# Patient Record
Sex: Female | Born: 1980
Health system: Southern US, Community
[De-identification: ages and names within clinical notes are randomized; demographics above are authoritative.]

## PROBLEM LIST (undated history)

## (undated) ENCOUNTER — Inpatient Hospital Stay (HOSPITAL_COMMUNITY): Payer: Self-pay

## (undated) DIAGNOSIS — D649 Anemia, unspecified: Secondary | ICD-10-CM

## (undated) DIAGNOSIS — J45909 Unspecified asthma, uncomplicated: Secondary | ICD-10-CM

## (undated) DIAGNOSIS — Z803 Family history of malignant neoplasm of breast: Secondary | ICD-10-CM

## (undated) DIAGNOSIS — K37 Unspecified appendicitis: Secondary | ICD-10-CM

## (undated) HISTORY — PX: WISDOM TOOTH EXTRACTION: SHX21

## (undated) HISTORY — PX: OTHER SURGICAL HISTORY: SHX169

## (undated) HISTORY — DX: Family history of malignant neoplasm of breast: Z80.3

---

## 2012-02-03 ENCOUNTER — Encounter (HOSPITAL_COMMUNITY): Payer: Self-pay | Admitting: *Deleted

## 2012-02-03 ENCOUNTER — Emergency Department (HOSPITAL_COMMUNITY)
Admission: EM | Admit: 2012-02-03 | Discharge: 2012-02-03 | Disposition: A | Discharge status: Home / Self Care | Payer: BC Managed Care – PPO | Attending: Emergency Medicine | Admitting: Emergency Medicine

## 2012-02-03 ENCOUNTER — Emergency Department (HOSPITAL_COMMUNITY): Payer: BC Managed Care – PPO

## 2012-02-03 DIAGNOSIS — R1013 Epigastric pain: Secondary | ICD-10-CM | POA: Insufficient documentation

## 2012-02-03 DIAGNOSIS — R110 Nausea: Secondary | ICD-10-CM | POA: Insufficient documentation

## 2012-02-03 DIAGNOSIS — R109 Unspecified abdominal pain: Secondary | ICD-10-CM

## 2012-02-03 HISTORY — DX: Anemia, unspecified: D64.9

## 2012-02-03 LAB — CBC WITH DIFFERENTIAL/PLATELET
Basophils Relative: 0 % (ref 0–1)
Eosinophils Absolute: 0.1 10*3/uL (ref 0.0–0.7)
Eosinophils Relative: 1 % (ref 0–5)
HCT: 36.3 % (ref 36.0–46.0)
Hemoglobin: 12 g/dL (ref 12.0–15.0)
MCH: 25.9 pg — ABNORMAL LOW (ref 26.0–34.0)
MCHC: 33.1 g/dL (ref 30.0–36.0)
Monocytes Absolute: 1 10*3/uL (ref 0.1–1.0)
Monocytes Relative: 8 % (ref 3–12)

## 2012-02-03 LAB — COMPREHENSIVE METABOLIC PANEL
Albumin: 4 g/dL (ref 3.5–5.2)
BUN: 15 mg/dL (ref 6–23)
Calcium: 9.6 mg/dL (ref 8.4–10.5)
Creatinine, Ser: 0.85 mg/dL (ref 0.50–1.10)
Total Bilirubin: 0.3 mg/dL (ref 0.3–1.2)
Total Protein: 6.9 g/dL (ref 6.0–8.3)

## 2012-02-03 LAB — POCT PREGNANCY, URINE: Preg Test, Ur: NEGATIVE

## 2012-02-03 LAB — LIPASE, BLOOD: Lipase: 32 U/L (ref 11–59)

## 2012-02-03 LAB — URINALYSIS, ROUTINE W REFLEX MICROSCOPIC
Bilirubin Urine: NEGATIVE
Glucose, UA: NEGATIVE mg/dL
Ketones, ur: NEGATIVE mg/dL
Protein, ur: NEGATIVE mg/dL
pH: 8 (ref 5.0–8.0)

## 2012-02-03 LAB — URINE MICROSCOPIC-ADD ON

## 2012-02-03 MED ORDER — PROMETHAZINE HCL 25 MG RE SUPP: 25.0000 mg | Freq: Four times a day (QID) | RECTAL | Status: DC | PRN

## 2012-02-03 MED ORDER — DICYCLOMINE HCL 10 MG PO CAPS
20.0000 mg | ORAL_CAPSULE | Freq: Once | ORAL | Status: AC
  Administered 2012-02-03: 20 mg via ORAL
  Filled 2012-02-03: qty 2

## 2012-02-03 MED ORDER — MORPHINE SULFATE 4 MG/ML IJ SOLN
4.0000 mg | Freq: Once | INTRAMUSCULAR | Status: AC
  Administered 2012-02-03: 4 mg via INTRAVENOUS
  Filled 2012-02-03: qty 1

## 2012-02-03 MED ORDER — ONDANSETRON HCL 4 MG PO TABS: 4.0000 mg | ORAL_TABLET | Freq: Three times a day (TID) | ORAL | Status: AC | PRN

## 2012-02-03 MED ORDER — PROMETHAZINE HCL 25 MG PO TABS: 25.0000 mg | ORAL_TABLET | Freq: Four times a day (QID) | ORAL | Status: DC | PRN

## 2012-02-03 MED ORDER — ONDANSETRON HCL 4 MG/2ML IJ SOLN
4.0000 mg | Freq: Once | INTRAMUSCULAR | Status: AC
  Administered 2012-02-03: 4 mg via INTRAVENOUS
  Filled 2012-02-03: qty 2

## 2012-02-03 MED ORDER — SODIUM CHLORIDE 0.9 % IV BOLUS (SEPSIS)
1000.0000 mL | Freq: Once | INTRAVENOUS | Status: AC
  Administered 2012-02-03: 1000 mL via INTRAVENOUS

## 2012-02-03 NOTE — ED Notes (Signed)
Pt unable to void at this time. 

## 2012-02-03 NOTE — ED Notes (Signed)
NAD noted upon discharge home. Prescriptions reviewed.

## 2012-02-03 NOTE — ED Notes (Signed)
Pt. To ultrasound.

## 2012-02-03 NOTE — ED Notes (Signed)
Pt c/o queasiness yesterday followed by epigastric pain and nausea around 10 pm last night.  States increase in bm, though no diarrhea.  Per husband, pt has been writhing in bed.

## 2012-02-03 NOTE — ED Provider Notes (Signed)
History     CSN: [id number]  Arrival date & time 02/03/12  4098   First MD Initiated Contact with Patient 02/03/12 [phone]      Chief Complaint  Patient presents with  . Abdominal Pain    (Consider location/radiation/quality/duration/timing/severity/associated sxs/prior treatment) HPI 31 year old female presents to emergency room complaining of upper abdominal pain along with nausea. Patient reports pain started around 10 PM this evening. She had a normal lunch, 2 bowel movements earlier in the day. Patient reports she was feeling queasy and did not eat much dinner, soon after eating she began to feel much worse. Patient has been unable to sleep well due to pain. She has tried stretching and yoga moves to help alleviate the pain. She has induced vomiting without improvement in symptoms. She denies any prior history of similar pain. No prior history of GERD or gastritis. No sick contacts, no fevers no diarrhea no travel no unusual foods.  Past Medical History  Diagnosis Date  . Anemia     History reviewed. No pertinent past surgical history.  No family history on file.  History  Substance Use Topics  . Smoking status: Not on file  . Smokeless tobacco: Not on file  . Alcohol Use:     OB History    Grav Para Term Preterm Abortions TAB SAB Ect Mult Living                  Review of Systems  All other systems reviewed and are negative.    Allergies  Review of patient's allergies indicates no known allergies.  Home Medications   Current Outpatient Rx  Name Route Sig Dispense Refill  . CALCIUM + D PO Oral Take 1 tablet by mouth daily.    Marland Kitchen FERROUS SULFATE 325 (65 FE) MG PO TABS Oral Take 325 mg by mouth daily with breakfast.    . SEASONIQUE PO Oral Take 1 tablet by mouth daily.    . ADULT MULTIVITAMIN W/MINERALS CH Oral Take 1 tablet by mouth daily.    . SULFAMETHOXAZOLE-TRIMETHOPRIM 400-80 MG PO TABS Oral Take 1 tablet by mouth daily. tx for cyst      BP 115/70   Pulse 75  Temp 98.4 F (36.9 C) (Oral)  Resp 18  SpO2 100%  Physical Exam  Nursing note and vitals reviewed. Constitutional: She is oriented to person, place, and time. She appears well-developed and well-nourished. She appears distressed (Uncomfortable appearing).  HENT:  Head: Normocephalic and atraumatic.  Nose: Nose normal.  Mouth/Throat: Oropharynx is clear and moist.  Eyes: Conjunctivae and EOM are normal. Pupils are equal, round, and reactive to light.  Neck: Normal range of motion. Neck supple. No JVD present. No tracheal deviation present. No thyromegaly present.  Cardiovascular: Normal rate, regular rhythm, normal heart sounds and intact distal pulses.  Exam reveals no gallop and no friction rub.   No murmur heard. Pulmonary/Chest: Effort normal and breath sounds normal. No stridor. No respiratory distress. She has no wheezes. She has no rales. She exhibits no tenderness.  Abdominal: Soft. Bowel sounds are normal. She exhibits no distension and no mass. There is tenderness (positive Murphy sign, tenderness across right upper quadrant epigastrium.). There is no rebound and no guarding.  Musculoskeletal: Normal range of motion. She exhibits no edema and no tenderness.  Lymphadenopathy:    She has no cervical adenopathy.  Neurological: She is oriented to person, place, and time. She exhibits normal muscle tone. Coordination normal.  Skin: Skin is warm and  dry. No rash noted. No erythema. No pallor.  Psychiatric: She has a normal mood and affect. Her behavior is normal. Judgment and thought content normal.    ED Course  Procedures (including critical care time)  Labs Reviewed  URINALYSIS, ROUTINE W REFLEX MICROSCOPIC - Abnormal; Notable for the following:    APPearance CLOUDY (*)     Leukocytes, UA TRACE (*)     All other components within normal limits  CBC WITH DIFFERENTIAL - Abnormal; Notable for the following:    WBC 12.7 (*)     MCH 25.9 (*)     Neutro Abs 9.4 (*)       All other components within normal limits  COMPREHENSIVE METABOLIC PANEL - Abnormal; Notable for the following:    Glucose, Bld 112 (*)     All other components within normal limits  URINE MICROSCOPIC-ADD ON - Abnormal; Notable for the following:    Squamous Epithelial / LPF MANY (*)     Bacteria, UA FEW (*)     All other components within normal limits  LIPASE, BLOOD  POCT PREGNANCY, URINE   US Abdomen Complete  02/03/2012  *RADIOLOGY REPORT*  Clinical Data:  Upper abdominal pain  COMPLETE ABDOMINAL ULTRASOUND  Comparison:  None.  Findings:  Gallbladder:  No gallstones, gallbladder wall thickening, or pericholecystic fluid.  Common bile duct:  Measures 3 mm, within normal limits.  Liver:  No focal lesion identified.  Within normal limits in parenchymal echogenicity.  IVC:  Appears normal.  Pancreas:  No focal abnormality seen.  Spleen:  Measures 6 cm oblique.  Normal echogenicity.  Right Kidney:  Measures 11.8 cm.  No hydronephrosis or focal abnormality.  Left Kidney:  Measures 12.2 cm. Suboptimally visualized due to limited acoustic windows/bowel gas artifact. Question duplicated collecting system.  No hydronephrosis or focal abnormality.  Abdominal aorta:  No aneurysm identified.  IMPRESSION: No cholelithiasis or sonographic evidence for cholecystitis.  The left kidney poorly visualized.  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  This may be technical/artifact.  Correlate with urinalysis if concerned for ascending infection.   Original Report Authenticated By: Waneta Martins, M.D.      1. Abdominal pain       MDM  31 year old female with acute upper abdominal pain. Differential includes gastritis, early gastroenteritis, cholelithiasis or cholecystitis. Will treat with pain and nausea medicine. We'll get abdominal ultrasound for possible cholelithiasis.   7:51 AM Pt feeling better, has tolerated PO fluids.  Will d/c home with nausea  medication, precautions for return.       Olivia Mackie, MD 02/03/12 [phone]

## 2013-10-23 ENCOUNTER — Observation Stay (HOSPITAL_COMMUNITY)
Admission: EM | Admit: 2013-10-23 | Discharge: 2013-10-24 | Disposition: A | Discharge status: Home / Self Care | Payer: BC Managed Care – PPO | Attending: General Surgery | Admitting: General Surgery

## 2013-10-23 ENCOUNTER — Emergency Department (HOSPITAL_COMMUNITY): Payer: BC Managed Care – PPO

## 2013-10-23 ENCOUNTER — Encounter (HOSPITAL_COMMUNITY)
Admission: EM | Disposition: A | Discharge status: Home / Self Care | Payer: Self-pay | Source: Home / Self Care | Attending: Emergency Medicine

## 2013-10-23 ENCOUNTER — Other Ambulatory Visit: Payer: Self-pay | Admitting: Family

## 2013-10-23 ENCOUNTER — Emergency Department (HOSPITAL_COMMUNITY): Payer: BC Managed Care – PPO | Admitting: Anesthesiology

## 2013-10-23 ENCOUNTER — Encounter (HOSPITAL_COMMUNITY): Payer: Self-pay | Admitting: Emergency Medicine

## 2013-10-23 ENCOUNTER — Encounter (HOSPITAL_COMMUNITY): Payer: BC Managed Care – PPO | Admitting: Anesthesiology

## 2013-10-23 DIAGNOSIS — R109 Unspecified abdominal pain: Secondary | ICD-10-CM

## 2013-10-23 DIAGNOSIS — J45909 Unspecified asthma, uncomplicated: Secondary | ICD-10-CM | POA: Insufficient documentation

## 2013-10-23 DIAGNOSIS — K3580 Unspecified acute appendicitis: Secondary | ICD-10-CM

## 2013-10-23 DIAGNOSIS — Z9049 Acquired absence of other specified parts of digestive tract: Secondary | ICD-10-CM

## 2013-10-23 HISTORY — DX: Unspecified asthma, uncomplicated: J45.909

## 2013-10-23 HISTORY — PX: LAPAROSCOPIC APPENDECTOMY: SHX408

## 2013-10-23 LAB — URINALYSIS, ROUTINE W REFLEX MICROSCOPIC
BILIRUBIN URINE: NEGATIVE
GLUCOSE, UA: NEGATIVE mg/dL
HGB URINE DIPSTICK: NEGATIVE
KETONES UR: NEGATIVE mg/dL
LEUKOCYTES UA: NEGATIVE
Nitrite: NEGATIVE
PH: 7.5 (ref 5.0–8.0)
PROTEIN: NEGATIVE mg/dL
Specific Gravity, Urine: 1.011 (ref 1.005–1.030)
Urobilinogen, UA: 0.2 mg/dL (ref 0.0–1.0)

## 2013-10-23 LAB — COMPREHENSIVE METABOLIC PANEL
ALK PHOS: 53 U/L (ref 39–117)
ALT: 51 U/L — AB (ref 0–35)
AST: 32 U/L (ref 0–37)
Albumin: 3.9 g/dL (ref 3.5–5.2)
BUN: 10 mg/dL (ref 6–23)
CHLORIDE: 102 meq/L (ref 96–112)
CO2: 27 meq/L (ref 19–32)
Calcium: 9.4 mg/dL (ref 8.4–10.5)
Creatinine, Ser: 0.72 mg/dL (ref 0.50–1.10)
GLUCOSE: 111 mg/dL — AB (ref 70–99)
Potassium: 4 mEq/L (ref 3.7–5.3)
SODIUM: 141 meq/L (ref 137–147)
Total Protein: 7 g/dL (ref 6.0–8.3)

## 2013-10-23 LAB — CBC WITH DIFFERENTIAL/PLATELET
BASOS ABS: 0 10*3/uL (ref 0.0–0.1)
Basophils Relative: 0 % (ref 0–1)
EOS PCT: 1 % (ref 0–5)
Eosinophils Absolute: 0.1 10*3/uL (ref 0.0–0.7)
HEMATOCRIT: 38.1 % (ref 36.0–46.0)
Hemoglobin: 12.6 g/dL (ref 12.0–15.0)
LYMPHS ABS: 2.2 10*3/uL (ref 0.7–4.0)
LYMPHS PCT: 31 % (ref 12–46)
MCH: 26.6 pg (ref 26.0–34.0)
MCHC: 33.1 g/dL (ref 30.0–36.0)
MCV: 80.5 fL (ref 78.0–100.0)
MONO ABS: 0.5 10*3/uL (ref 0.1–1.0)
Monocytes Relative: 8 % (ref 3–12)
NEUTROS ABS: 4.4 10*3/uL (ref 1.7–7.7)
Neutrophils Relative %: 60 % (ref 43–77)
PLATELETS: 251 10*3/uL (ref 150–400)
RBC: 4.73 MIL/uL (ref 3.87–5.11)
RDW: 13.5 % (ref 11.5–15.5)
WBC: 7.2 10*3/uL (ref 4.0–10.5)

## 2013-10-23 LAB — WET PREP, GENITAL
CLUE CELLS WET PREP: NONE SEEN
Trich, Wet Prep: NONE SEEN
YEAST WET PREP: NONE SEEN

## 2013-10-23 LAB — RPR

## 2013-10-23 LAB — LIPASE, BLOOD: Lipase: 25 U/L (ref 11–59)

## 2013-10-23 LAB — POC URINE PREG, ED: Preg Test, Ur: NEGATIVE

## 2013-10-23 SURGERY — APPENDECTOMY, LAPAROSCOPIC
Anesthesia: General | Site: Abdomen

## 2013-10-23 MED ORDER — SUCCINYLCHOLINE CHLORIDE 20 MG/ML IJ SOLN
INTRAMUSCULAR | Status: DC | PRN
  Administered 2013-10-23: 100 mg via INTRAVENOUS

## 2013-10-23 MED ORDER — MIDAZOLAM HCL 2 MG/2ML IJ SOLN
INTRAMUSCULAR | Status: DC | PRN
  Administered 2013-10-23: 2 mg via INTRAVENOUS

## 2013-10-23 MED ORDER — BUPIVACAINE-EPINEPHRINE (PF) 0.25% -1:200000 IJ SOLN
INTRAMUSCULAR | Status: AC
  Filled 2013-10-23: qty 30

## 2013-10-23 MED ORDER — METRONIDAZOLE IN NACL 5-0.79 MG/ML-% IV SOLN
500.0000 mg | Freq: Once | INTRAVENOUS | Status: AC
  Administered 2013-10-23: 500 mg via INTRAVENOUS
  Filled 2013-10-23: qty 100

## 2013-10-23 MED ORDER — SODIUM CHLORIDE 0.9 % IR SOLN
Status: DC | PRN
  Administered 2013-10-23: 1000 mL

## 2013-10-23 MED ORDER — LIDOCAINE HCL (CARDIAC) 20 MG/ML IV SOLN
INTRAVENOUS | Status: AC
  Filled 2013-10-23: qty 5

## 2013-10-23 MED ORDER — LACTATED RINGERS IV SOLN
INTRAVENOUS | Status: DC | PRN
  Administered 2013-10-23: 22:00:00 via INTRAVENOUS

## 2013-10-23 MED ORDER — FENTANYL CITRATE 0.05 MG/ML IJ SOLN
INTRAMUSCULAR | Status: DC | PRN
  Administered 2013-10-23: 150 ug via INTRAVENOUS
  Administered 2013-10-23: 50 ug via INTRAVENOUS

## 2013-10-23 MED ORDER — ROCURONIUM BROMIDE 100 MG/10ML IV SOLN
INTRAVENOUS | Status: DC | PRN
  Administered 2013-10-23: 30 mg via INTRAVENOUS

## 2013-10-23 MED ORDER — FENTANYL CITRATE 0.05 MG/ML IJ SOLN
INTRAMUSCULAR | Status: AC
  Filled 2013-10-23: qty 5

## 2013-10-23 MED ORDER — IOHEXOL 300 MG/ML  SOLN
100.0000 mL | Freq: Once | INTRAMUSCULAR | Status: AC | PRN
  Administered 2013-10-23: 100 mL via INTRAVENOUS

## 2013-10-23 MED ORDER — PROPOFOL 10 MG/ML IV BOLUS
INTRAVENOUS | Status: DC | PRN
  Administered 2013-10-23: 120 mg via INTRAVENOUS

## 2013-10-23 MED ORDER — ONDANSETRON HCL 4 MG/2ML IJ SOLN
INTRAMUSCULAR | Status: DC | PRN
  Administered 2013-10-23: 4 mg via INTRAVENOUS

## 2013-10-23 MED ORDER — DEXTROSE 5 % IV SOLN
1.0000 g | Freq: Once | INTRAVENOUS | Status: AC
  Administered 2013-10-23: 1 g via INTRAVENOUS
  Filled 2013-10-23: qty 10

## 2013-10-23 MED ORDER — BUPIVACAINE-EPINEPHRINE 0.25% -1:200000 IJ SOLN
INTRAMUSCULAR | Status: DC | PRN
  Administered 2013-10-23: 14 mL

## 2013-10-23 MED ORDER — DEXAMETHASONE SODIUM PHOSPHATE 10 MG/ML IJ SOLN
INTRAMUSCULAR | Status: DC | PRN
  Administered 2013-10-23: 4 mg via INTRAVENOUS

## 2013-10-23 MED ORDER — NEOSTIGMINE METHYLSULFATE 10 MG/10ML IV SOLN
INTRAVENOUS | Status: DC | PRN
  Administered 2013-10-23: 4 mg via INTRAVENOUS

## 2013-10-23 MED ORDER — MIDAZOLAM HCL 2 MG/2ML IJ SOLN
INTRAMUSCULAR | Status: AC
  Filled 2013-10-23: qty 2

## 2013-10-23 MED ORDER — LIDOCAINE HCL (CARDIAC) 20 MG/ML IV SOLN
INTRAVENOUS | Status: DC | PRN
  Administered 2013-10-23: 100 mg via INTRAVENOUS

## 2013-10-23 MED ORDER — PROPOFOL 10 MG/ML IV BOLUS
INTRAVENOUS | Status: AC
  Filled 2013-10-23: qty 20

## 2013-10-23 MED ORDER — GLYCOPYRROLATE 0.2 MG/ML IJ SOLN
INTRAMUSCULAR | Status: DC | PRN
  Administered 2013-10-23: 0.6 mg via INTRAVENOUS

## 2013-10-23 SURGICAL SUPPLY — 48 items
APPLIER CLIP ROT 10 11.4 M/L (STAPLE)
BLADE SURG ROTATE 9660 (MISCELLANEOUS) IMPLANT
CANISTER SUCTION 2500CC (MISCELLANEOUS) ×2 IMPLANT
CHLORAPREP W/TINT 26ML (MISCELLANEOUS) ×2 IMPLANT
CLIP APPLIE ROT 10 11.4 M/L (STAPLE) IMPLANT
COVER SURGICAL LIGHT HANDLE (MISCELLANEOUS) ×2 IMPLANT
CUTTER LINEAR ENDO 35 ETS (STAPLE) IMPLANT
CUTTER LINEAR ENDO 35 ETS TH (STAPLE) ×2 IMPLANT
DECANTER SPIKE VIAL GLASS SM (MISCELLANEOUS) ×2 IMPLANT
DERMABOND ADVANCED (GAUZE/BANDAGES/DRESSINGS) ×1
DERMABOND ADVANCED .7 DNX12 (GAUZE/BANDAGES/DRESSINGS) ×1 IMPLANT
DRAPE UTILITY 15X26 W/TAPE STR (DRAPE) ×4 IMPLANT
ELECT REM PT RETURN 9FT ADLT (ELECTROSURGICAL) ×2
ELECTRODE REM PT RTRN 9FT ADLT (ELECTROSURGICAL) ×1 IMPLANT
ENDOLOOP SUT PDS II  0 18 (SUTURE)
ENDOLOOP SUT PDS II 0 18 (SUTURE) IMPLANT
GLOVE BIO SURGEON STRL SZ8 (GLOVE) ×2 IMPLANT
GLOVE BIOGEL PI IND STRL 7.0 (GLOVE) ×2 IMPLANT
GLOVE BIOGEL PI IND STRL 8 (GLOVE) ×1 IMPLANT
GLOVE BIOGEL PI INDICATOR 7.0 (GLOVE) ×2
GLOVE BIOGEL PI INDICATOR 8 (GLOVE) ×1
GLOVE SURG SS PI 6.0 STRL IVOR (GLOVE) ×4 IMPLANT
GOWN STRL REUS W/ TWL LRG LVL3 (GOWN DISPOSABLE) ×1 IMPLANT
GOWN STRL REUS W/ TWL XL LVL3 (GOWN DISPOSABLE) ×1 IMPLANT
GOWN STRL REUS W/TWL LRG LVL3 (GOWN DISPOSABLE) ×1
GOWN STRL REUS W/TWL XL LVL3 (GOWN DISPOSABLE) ×1
KIT BASIN OR (CUSTOM PROCEDURE TRAY) ×2 IMPLANT
KIT ROOM TURNOVER OR (KITS) ×2 IMPLANT
NEEDLE 22X1 1/2 (OR ONLY) (NEEDLE) ×2 IMPLANT
NS IRRIG 1000ML POUR BTL (IV SOLUTION) ×2 IMPLANT
PAD ARMBOARD 7.5X6 YLW CONV (MISCELLANEOUS) ×4 IMPLANT
POUCH SPECIMEN RETRIEVAL 10MM (ENDOMECHANICALS) ×4 IMPLANT
RELOAD /EVU35 (ENDOMECHANICALS) IMPLANT
RELOAD CUTTER ETS 35MM STAND (ENDOMECHANICALS) IMPLANT
SCALPEL HARMONIC ACE (MISCELLANEOUS) ×2 IMPLANT
SET IRRIG TUBING LAPAROSCOPIC (IRRIGATION / IRRIGATOR) ×2 IMPLANT
SPECIMEN JAR SMALL (MISCELLANEOUS) ×2 IMPLANT
SUT VIC AB 4-0 PS2 27 (SUTURE) ×2 IMPLANT
SWAB COLLECTION DEVICE MRSA (MISCELLANEOUS) IMPLANT
TOWEL OR 17X24 6PK STRL BLUE (TOWEL DISPOSABLE) ×2 IMPLANT
TOWEL OR 17X26 10 PK STRL BLUE (TOWEL DISPOSABLE) ×2 IMPLANT
TRAY FOLEY CATH 16FR SILVER (SET/KITS/TRAYS/PACK) ×2 IMPLANT
TRAY LAPAROSCOPIC (CUSTOM PROCEDURE TRAY) ×2 IMPLANT
TROCAR XCEL 12X100 BLDLESS (ENDOMECHANICALS) ×2 IMPLANT
TROCAR XCEL BLUNT TIP 100MML (ENDOMECHANICALS) ×2 IMPLANT
TROCAR XCEL NON-BLD 5MMX100MML (ENDOMECHANICALS) ×2 IMPLANT
TUBE ANAEROBIC SPECIMEN COL (MISCELLANEOUS) IMPLANT
WATER STERILE IRR 1000ML POUR (IV SOLUTION) IMPLANT

## 2013-10-23 NOTE — ED Notes (Signed)
Informed CT that pt is finished drinking IV contrast.

## 2013-10-23 NOTE — ED Notes (Addendum)
Pt reports onset yesterday morning of lower abd pain, more severe right lower side. Having n/v yesterday, denies any vaginal or urinary symptoms. Pt went to pcp today had blood work and was told to come here due to possible appendicitis.

## 2013-10-23 NOTE — Op Note (Signed)
10/23/2013  11:31 PM  PATIENT:  Kari Hendricks  33 y.o. female  PRE-OPERATIVE DIAGNOSIS:  acute appendicitis  POST-OPERATIVE DIAGNOSIS:  Acute Appendicitis  PROCEDURE:  Procedure(s): APPENDECTOMY LAPAROSCOPIC  SURGEON:  Surgeon(s): Liz MaladyBurke E Montasia Chisenhall, MD  ASSISTANTS: none   ANESTHESIA:   local and general  EBL:  Total I/O In: 800 [I.V.:800] Out: 100 [Urine:100]  BLOOD ADMINISTERED:none  DRAINS: none   SPECIMEN:  Excision  DISPOSITION OF SPECIMEN:  PATHOLOGY  COUNTS:  YES  DICTATION: .Dragon Dictation patient is brought for laparoscopic appendectomy. She was identified in the preop holding area. She received intravenous antibiotic. Informed consent was obtained. She was brought to the operating room and general endotracheal anesthesia was administered by the anesthesia staff. Foley catheter was placed by nursing. Abdomen was prepped and draped in sterile fashion. We did time out procedure. Infraumbilical region was infiltrated with local. Infraumbilical incision was made. Subcutaneous tissues were dissected down through the anterior fascia. Peritoneal cavity was entered under direct vision. 0 Vicryl pursestring suture was placed on the fascial opening. Hassan trocar was inserted. Abdomen was insufflated with carbon monoxide in standard fashion. Under direct vision, a 12 mm left lower quadrant a 5 mm right mid abdomen port were placed. Local was used at each port site. Laparoscopic exploration revealed a very floppy cecum. The appendix was retrocecal but easily identified. It was not perforated but was acutely inflamed. The base was dissected and then divided with Endo GIA stapler. The first stapler did not fire correctly so it was repeated. There was good staple line closure on the cecum. The mesoappendix was then gradually divided with harmonic scalpel staying along the appendix. Appendix was placed in Endo Catch bag and removed via the left lower quadrant port site. Abdomen was  copiously irrigated. Staple line remained intact. There was no bleeding. Irrigation fluid returned clear. Ports were removed under direct vision. Pneumoperitoneum was released. Infraumbilical fascia was closed by tying the pursestring. All 3 wounds were copiously irrigated and the skin of each was closed with running 4-0 Vicryl subcuticular followed by Dermabond. All counts were correct. Patient tolerated procedure well without apparent complication was taken recovery in stable condition.  PATIENT DISPOSITION:  PACU - hemodynamically stable.   Delay start of Pharmacological VTE agent (>24hrs) due to surgical blood loss or risk of bleeding:  no  Violeta GelinasBurke Taleah Bellantoni, MD, MPH, FACS Pager: [phone]  5/19/201511:31 PM

## 2013-10-23 NOTE — Anesthesia Procedure Notes (Signed)
Procedure Name: Intubation Date/Time: 10/23/2013 10:34 PM Performed by: Molli HazardGORDON, Aaniya Sterba M Pre-anesthesia Checklist: Patient identified, Emergency Drugs available, Suction available and Patient being monitored Patient Re-evaluated:Patient Re-evaluated prior to inductionOxygen Delivery Method: Circle system utilized Preoxygenation: Pre-oxygenation with 100% oxygen Intubation Type: IV induction, Rapid sequence and Cricoid Pressure applied Laryngoscope Size: Miller and 2 Grade View: Grade I Tube type: Oral Tube size: 7.0 mm Number of attempts: 1 Airway Equipment and Method: Stylet Placement Confirmation: ETT inserted through vocal cords under direct vision,  positive ETCO2 and breath sounds checked- equal and bilateral Secured at: 21 cm Tube secured with: Tape Dental Injury: Teeth and Oropharynx as per pre-operative assessment

## 2013-10-23 NOTE — ED Provider Notes (Signed)
CSN: [id number]     Arrival date & time 10/23/13  1447 History   First MD Initiated Contact with Patient 10/23/13 1746     Chief Complaint  Patient presents with  . Abdominal Pain     (Consider location/radiation/quality/duration/timing/severity/associated sxs/prior Treatment) HPI Comments: Kari LeiLauren Hendricks is a 33 y.o. Female presenting with right lower quadrant pain which woke her from sleep yesterday morning.  Her pain was severe, sharp, constant and worsened with movement and with lying flat, improved lying on her side in a curled up position.  She also endorses nausea and vomiting multiple episodes but denies diarrhea. Her last bm was yesterday and normal.  She was seen by her pcp yesterday at which time blood work was obtained.  She was called to today and advised to come here to get a CT scan for assess for appendicitis as her wbc count was elevated (was not told the lab number).  Her pain is still present today but improved.  She denies fevers or chills, back pain, vaginal discharge, dysuria.  She has taken ibuprofen without relief of pain.  She is married, denies risk factors for std's.     The history is provided by the patient.    Past Medical History  Diagnosis Date  . Anemia   . Asthma    History reviewed. No pertinent past surgical history. History reviewed. No pertinent family history. History  Substance Use Topics  . Smoking status: Never Smoker   . Smokeless tobacco: Not on file  . Alcohol Use: No   OB History   Grav Para Term Preterm Abortions TAB SAB Ect Mult Living                 Review of Systems  Constitutional: Negative for fever.  HENT: Negative for congestion and sore throat.   Eyes: Negative.   Respiratory: Negative for chest tightness and shortness of breath.   Cardiovascular: Negative for chest pain.  Gastrointestinal: Positive for nausea, vomiting and abdominal pain. Negative for diarrhea and constipation.  Genitourinary: Negative.    Musculoskeletal: Negative for arthralgias, joint swelling and neck pain.  Skin: Negative.  Negative for rash and wound.  Neurological: Negative for dizziness, weakness, light-headedness, numbness and headaches.  Psychiatric/Behavioral: Negative.       Allergies  Review of patient's allergies indicates no known allergies.  Home Medications   Prior to Admission medications   Medication Sig Start Date End Date Taking? Authorizing Provider  Calcium Carbonate-Vitamin D (CALCIUM + D PO) Take 1 tablet by mouth daily.   Yes Historical Provider, MD  ferrous sulfate 325 (65 FE) MG tablet Take 325 mg by mouth daily with breakfast.   Yes Historical Provider, MD  ibuprofen (ADVIL,MOTRIN) 200 MG tablet Take 400 mg by mouth every 6 (six) hours as needed for fever.   Yes Historical Provider, MD  Levonorgest-Eth Estrad 91-Day (SEASONIQUE PO) Take 1 tablet by mouth daily.   Yes Historical Provider, MD  Multiple Vitamin (MULTIVITAMIN WITH MINERALS) TABS Take 1 tablet by mouth daily.   Yes Historical Provider, MD  promethazine (PHENERGAN) 25 MG suppository Place 1 suppository (25 mg total) rectally every 6 (six) hours as needed for nausea. 02/03/12 02/10/12  Olivia Mackielga M Otter, MD  promethazine (PHENERGAN) 25 MG tablet Take 1 tablet (25 mg total) by mouth every 6 (six) hours as needed for nausea. 02/03/12 02/10/12  Olivia Mackielga M Otter, MD   BP 128/79  Pulse 71  Temp(Src) 99.1 F (37.3 C) (Oral)  Resp 18  Ht 5\' 4"  (1.626 m)  Wt 125 lb (56.7 kg)  BMI 21.45 kg/m2  SpO2 99%  LMP 10/16/2013 Physical Exam  Nursing note and vitals reviewed. Constitutional: She appears well-developed and well-nourished.  HENT:  Head: Normocephalic and atraumatic.  Eyes: Conjunctivae are normal.  Neck: Normal range of motion.  Cardiovascular: Normal rate, regular rhythm, normal heart sounds and intact distal pulses.   Pulmonary/Chest: Effort normal and breath sounds normal. She has no wheezes.  Abdominal: Soft. Bowel sounds are normal.  She exhibits no distension and no mass. There is no hepatosplenomegaly. There is tenderness in the right lower quadrant. There is no guarding and no CVA tenderness.  Genitourinary: Vagina normal and uterus normal. Uterus is not enlarged and not tender. Cervix exhibits no motion tenderness and no discharge. Right adnexum displays no mass, no tenderness and no fullness. Left adnexum displays no mass, no tenderness and no fullness. No tenderness around the vagina. No vaginal discharge found.  Musculoskeletal: Normal range of motion.  Neurological: She is alert.  Skin: Skin is warm and dry.  Psychiatric: She has a normal mood and affect.    ED Course  Procedures (including critical care time) Labs Review Labs Reviewed  WET PREP, GENITAL - Abnormal; Notable for the following:    WBC, Wet Prep HPF POC FEW (*)    All other components within normal limits  COMPREHENSIVE METABOLIC PANEL - Abnormal; Notable for the following:    Glucose, Bld 111 (*)    ALT 51 (*)    Total Bilirubin <0.2 (*)    All other components within normal limits  GC/CHLAMYDIA PROBE AMP  CBC WITH DIFFERENTIAL  LIPASE, BLOOD  URINALYSIS, ROUTINE W REFLEX MICROSCOPIC  RPR  POC URINE PREG, ED    Imaging Review Ct Abdomen Pelvis W Contrast  10/23/2013   CLINICAL DATA:  Diffuse abdominal pain.  Vomiting.  EXAM: CT ABDOMEN AND PELVIS WITH CONTRAST  TECHNIQUE: Multidetector CT imaging of the abdomen and pelvis was performed using the standard protocol following bolus administration of intravenous contrast.  CONTRAST:  100mL OMNIPAQUE IOHEXOL 300 MG/ML  SOLN  COMPARISON:  None.  FINDINGS: The patient has acute appendicitis. The appendix is edematous and measures 11 mm in diameter. The cecum lies in the mid pelvis and the appendix is retrocecal and extends superiorly and laterally. There is no perforation. Terminal ileum is normal.  Liver, biliary tree, spleen, pancreas, adrenal glands, and kidneys demonstrate no significant  abnormality. 5 mm low-density lesion in the upper pole of the left kidney most likely represents a tiny cyst.  IMPRESSION: Acute appendicitis. Critical Value/emergent results were called by telephone at the time of interpretation on 10/23/2013 at 7:55 PM to Ms. Gershon Mussel'Malley, [place], who verbally acknowledged these results.   Electronically Signed   By: Geanie CooleyJim  Maxwell M.D.   On: 10/23/2013 19:56     EKG Interpretation None      MDM   Final diagnoses:  Acute appendicitis    Patients labs and/or radiological studies were viewed and considered during the medical decision making and disposition process. Labs normal today except borderline elevated AST.  Discussed with patient.  She reports drinks a glass of wine qod on average,  Last drank a glass 2 days ago.  ? Elevation in this lab.  No RUQ pain,  No + murphy sign.  Call from CT radiology - acute appendicitis.  US cancelled.  Will discuss with general surgery for admission.  Results discussed with patient and her questions were answered.  9:00 PM Still awaiting call back from gen surg.  Asked for repage.  Call back from Dr. Janee Morn.  Will see pt in ed.  Burgess Amor, PA-C 10/23/13 2002  Burgess Amor, PA-C 10/23/13 2021  Burgess Amor, PA-C 10/23/13 2100  Burgess Amor, PA-C 10/23/13 2102

## 2013-10-23 NOTE — Anesthesia Preprocedure Evaluation (Addendum)
Anesthesia Evaluation  Patient identified by MRN, date of birth, ID band Patient awake    Reviewed: Allergy & Precautions, H&P , NPO status , Patient's Chart, lab work & pertinent test results  Airway Mallampati: I TM Distance: >3 FB Neck ROM: Full    Dental  (+) Teeth Intact, Dental Advisory Given   Pulmonary          Cardiovascular     Neuro/Psych    GI/Hepatic   Endo/Other    Renal/GU      Musculoskeletal   Abdominal   Peds  Hematology   Anesthesia Other Findings   Reproductive/Obstetrics                           Anesthesia Physical Anesthesia Plan  ASA: II  Anesthesia Plan: General   Post-op Pain Management:    Induction: Intravenous, Cricoid pressure planned and Rapid sequence  Airway Management Planned: Oral ETT  Additional Equipment:   Intra-op Plan:   Post-operative Plan: Extubation in OR  Informed Consent: I have reviewed the patients History and Physical, chart, labs and discussed the procedure including the risks, benefits and alternatives for the proposed anesthesia with the patient or authorized representative who has indicated his/her understanding and acceptance.   Dental advisory given  Plan Discussed with: Surgeon and CRNA  Anesthesia Plan Comments:        Anesthesia Quick Evaluation

## 2013-10-23 NOTE — H&P (Signed)
Kari Hendricks is an 33 y.o. female.   Chief Complaint: right lower quadrant abdominal pain HPI: patient developed vague lower abdominal pain yesterday morning. She thought it was associated with gas. She had mild anorexia. She was seen at her primary care office and some laboratory studies were drawn. She initially felt a little better. The pain came back worse today and her doctor's office called and told her she had an elevated white count. They directed her to the emergency room for further workup. We put to count here is normal, however, CT scan of the abdomen and pelvis was done showing acute appendicitis. She continues to have right lower quadrant pain.  Past Medical History  Diagnosis Date  . Anemia   . Asthma     History reviewed. No pertinent past surgical history.  History reviewed. No pertinent family history. Social History:  reports that she has never smoked. She does not have any smokeless tobacco history on file. She reports that she does not drink alcohol or use illicit drugs.  Allergies: No Known Allergies   (Not in a hospital admission)  Results for orders placed during the hospital encounter of 10/23/13 (from the past 48 hour(s))  CBC WITH DIFFERENTIAL     Status: None   Collection Time    10/23/13  3:00 PM      Result Value Ref Range   WBC 7.2  4.0 - 10.5 K/uL   RBC 4.73  3.87 - 5.11 MIL/uL   Hemoglobin 12.6  12.0 - 15.0 g/dL   HCT 38.1  36.0 - 46.0 %   MCV 80.5  78.0 - 100.0 fL   MCH 26.6  26.0 - 34.0 pg   MCHC 33.1  30.0 - 36.0 g/dL   RDW 13.5  11.5 - 15.5 %   Platelets 251  150 - 400 K/uL   Neutrophils Relative % 60  43 - 77 %   Neutro Abs 4.4  1.7 - 7.7 K/uL   Lymphocytes Relative 31  12 - 46 %   Lymphs Abs 2.2  0.7 - 4.0 K/uL   Monocytes Relative 8  3 - 12 %   Monocytes Absolute 0.5  0.1 - 1.0 K/uL   Eosinophils Relative 1  0 - 5 %   Eosinophils Absolute 0.1  0.0 - 0.7 K/uL   Basophils Relative 0  0 - 1 %   Basophils Absolute 0.0  0.0 - 0.1 K/uL   COMPREHENSIVE METABOLIC PANEL     Status: Abnormal   Collection Time    10/23/13  3:00 PM      Result Value Ref Range   Sodium 141  137 - 147 mEq/L   Potassium 4.0  3.7 - 5.3 mEq/L   Chloride 102  96 - 112 mEq/L   CO2 27  19 - 32 mEq/L   Glucose, Bld 111 (*) 70 - 99 mg/dL   BUN 10  6 - 23 mg/dL   Creatinine, Ser 0.72  0.50 - 1.10 mg/dL   Calcium 9.4  8.4 - 10.5 mg/dL   Total Protein 7.0  6.0 - 8.3 g/dL   Albumin 3.9  3.5 - 5.2 g/dL   AST 32  0 - 37 U/L   ALT 51 (*) 0 - 35 U/L   Alkaline Phosphatase 53  39 - 117 U/L   Total Bilirubin <0.2 (*) 0.3 - 1.2 mg/dL   GFR calc non Af Amer >90  >90 mL/min   GFR calc Af Amer >90  >90 mL/min  Comment: (NOTE)     The eGFR has been calculated using the CKD EPI equation.     This calculation has not been validated in all clinical situations.     eGFR's persistently <90 mL/min signify possible Chronic Kidney     Disease.  LIPASE, BLOOD     Status: None   Collection Time    10/23/13  3:00 PM      Result Value Ref Range   Lipase 25  11 - 59 U/L  URINALYSIS, ROUTINE W REFLEX MICROSCOPIC     Status: None   Collection Time    10/23/13  4:06 PM      Result Value Ref Range   Color, Urine YELLOW  YELLOW   APPearance CLEAR  CLEAR   Specific Gravity, Urine 1.011  1.005 - 1.030   pH 7.5  5.0 - 8.0   Glucose, UA NEGATIVE  NEGATIVE mg/dL   Hgb urine dipstick NEGATIVE  NEGATIVE   Bilirubin Urine NEGATIVE  NEGATIVE   Ketones, ur NEGATIVE  NEGATIVE mg/dL   Protein, ur NEGATIVE  NEGATIVE mg/dL   Urobilinogen, UA 0.2  0.0 - 1.0 mg/dL   Nitrite NEGATIVE  NEGATIVE   Leukocytes, UA NEGATIVE  NEGATIVE   Comment: MICROSCOPIC NOT DONE ON URINES WITH NEGATIVE PROTEIN, BLOOD, LEUKOCYTES, NITRITE, OR GLUCOSE <1000 mg/dL.  POC URINE PREG, ED     Status: None   Collection Time    10/23/13  4:14 PM      Result Value Ref Range   Preg Test, Ur NEGATIVE  NEGATIVE   Comment:            THE SENSITIVITY OF THIS     METHODOLOGY IS >24 mIU/mL  WET PREP, GENITAL      Status: Abnormal   Collection Time    10/23/13  7:07 PM      Result Value Ref Range   Yeast Wet Prep HPF POC NONE SEEN  NONE SEEN   Trich, Wet Prep NONE SEEN  NONE SEEN   Clue Cells Wet Prep HPF POC NONE SEEN  NONE SEEN   WBC, Wet Prep HPF POC FEW (*) NONE SEEN   Ct Abdomen Pelvis W Contrast  10/23/2013   CLINICAL DATA:  Diffuse abdominal pain.  Vomiting.  EXAM: CT ABDOMEN AND PELVIS WITH CONTRAST  TECHNIQUE: Multidetector CT imaging of the abdomen and pelvis was performed using the standard protocol following bolus administration of intravenous contrast.  CONTRAST:  128m OMNIPAQUE IOHEXOL 300 MG/ML  SOLN  COMPARISON:  None.  FINDINGS: The patient has acute appendicitis. The appendix is edematous and measures 11 mm in diameter. The cecum lies in the mid pelvis and the appendix is retrocecal and extends superiorly and laterally. There is no perforation. Terminal ileum is normal.  Liver, biliary tree, spleen, pancreas, adrenal glands, and kidneys demonstrate no significant abnormality. 5 mm low-density lesion in the upper pole of the left kidney most likely represents a tiny cyst.  IMPRESSION: Acute appendicitis. Critical Value/emergent results were called by telephone at the time of interpretation on 10/23/2013 at 7:55 PM to Ms. OHilda Blades PUtah who verbally acknowledged these results.   Electronically Signed   By: JRozetta NunneryM.D.   On: 10/23/2013 19:56    Review of Systems  Constitutional: Negative for fever and chills.  HENT: Negative.   Eyes: Negative.   Cardiovascular: Negative.   Gastrointestinal: Positive for nausea and abdominal pain. Negative for vomiting.  Genitourinary: Negative.   Musculoskeletal: Negative.   Skin: Negative.  Neurological: Negative.   Endo/Heme/Allergies: Negative.   Psychiatric/Behavioral: Negative.     Blood pressure 143/83, pulse 79, temperature 99.1 F (37.3 C), temperature source Oral, resp. rate 16, height '5\' 4"'  (1.626 m), weight 125 lb (56.7 kg), last  menstrual period 10/16/2013, SpO2 100.00%. Physical Exam  Constitutional: She is oriented to person, place, and time. She appears well-developed and well-nourished. No distress.  HENT:  Head: Normocephalic and atraumatic.  Nose: Nose normal.  Mouth/Throat: Oropharynx is clear and moist.  Eyes: EOM are normal. Pupils are equal, round, and reactive to light. Right eye exhibits no discharge. Left eye exhibits no discharge. No scleral icterus.  Neck: Normal range of motion. Neck supple. No tracheal deviation present.  Cardiovascular: Normal rate, normal heart sounds and intact distal pulses.   Respiratory: Effort normal and breath sounds normal. No stridor. No respiratory distress. She has no wheezes. She has no rales. She exhibits no tenderness.  GI: Soft. She exhibits no distension. There is tenderness. There is no rebound and no guarding.  Tender right lower quadrant without guarding, no peritonitis  Musculoskeletal: Normal range of motion.  Neurological: She is alert and oriented to person, place, and time.  Skin: Skin is warm.  Psychiatric: She has a normal mood and affect.     Assessment/Plan Acute appendicitis: will start IV antibiotics and take to the operating room for laparoscopic appendectomy. Procedure, risks, & benefits were discussed in detail the patient and her husband. She is agreeable.  Zenovia Jarred 10/23/2013, 9:33 PM

## 2013-10-23 NOTE — ED Notes (Signed)
Pt unable to obtain urine sample at this time 

## 2013-10-23 NOTE — Transfer of Care (Signed)
Immediate Anesthesia Transfer of Care Note  Patient: Kari Hendricks LeiLauren Hendricks  Procedure(s) Performed: Procedure(s): APPENDECTOMY LAPAROSCOPIC (N/A)  Patient Location: PACU  Anesthesia Type:General  Level of Consciousness: awake, alert  and oriented  Airway & Oxygen Therapy: Patient Spontanous Breathing and Patient connected to nasal cannula oxygen  Post-op Assessment: Report given to PACU RN, Post -op Vital signs reviewed and stable and Patient moving all extremities X 4  Post vital signs: Reviewed and stable  Complications: No apparent anesthesia complications

## 2013-10-24 ENCOUNTER — Encounter (INDEPENDENT_AMBULATORY_CARE_PROVIDER_SITE_OTHER): Payer: Self-pay

## 2013-10-24 DIAGNOSIS — K3580 Unspecified acute appendicitis: Secondary | ICD-10-CM | POA: Diagnosis present

## 2013-10-24 LAB — GC/CHLAMYDIA PROBE AMP
CT Probe RNA: NEGATIVE
GC Probe RNA: NEGATIVE

## 2013-10-24 MED ORDER — MORPHINE SULFATE 2 MG/ML IJ SOLN
2.0000 mg | INTRAMUSCULAR | Status: DC | PRN
  Administered 2013-10-24: 2 mg via INTRAVENOUS
  Filled 2013-10-24: qty 1

## 2013-10-24 MED ORDER — ACETAMINOPHEN 325 MG PO TABS: 650.0000 mg | ORAL_TABLET | ORAL | Status: DC | PRN

## 2013-10-24 MED ORDER — OXYCODONE-ACETAMINOPHEN 5-325 MG PO TABS
1.0000 | ORAL_TABLET | ORAL | Status: DC | PRN
  Administered 2013-10-24 (×2): 2 via ORAL
  Filled 2013-10-24 (×2): qty 2

## 2013-10-24 MED ORDER — ONDANSETRON HCL 4 MG PO TABS: 4.0000 mg | ORAL_TABLET | Freq: Four times a day (QID) | ORAL | Status: DC | PRN

## 2013-10-24 MED ORDER — ONDANSETRON HCL 4 MG/2ML IJ SOLN
4.0000 mg | Freq: Four times a day (QID) | INTRAMUSCULAR | Status: DC | PRN
Start: 2013-10-24 — End: 2013-10-24

## 2013-10-24 MED ORDER — HEPARIN SODIUM (PORCINE) 5000 UNIT/ML IJ SOLN
5000.0000 [IU] | Freq: Three times a day (TID) | INTRAMUSCULAR | Status: DC
  Filled 2013-10-24 (×3): qty 1

## 2013-10-24 MED ORDER — OXYCODONE-ACETAMINOPHEN 5-325 MG PO TABS: 1.0000 | ORAL_TABLET | Freq: Four times a day (QID) | ORAL | Status: DC | PRN

## 2013-10-24 NOTE — Discharge Summary (Signed)
Physician Discharge Summary  Patient ID: Kari Hendricks MRN: [id number] DOB/AGE: May 22, 1981 32 y.o.  Admit date: 10/23/2013 Discharge date: 10/24/2013  Admitting Diagnosis: Acute appendicitis  Discharge Diagnosis Patient Active Problem List   Diagnosis Date Noted  . Acute appendicitis 10/24/2013  . S/P laparoscopic appendectomy 10/23/2013    Consultants None  Imaging: Ct Abdomen Pelvis W Contrast  10/23/2013   CLINICAL DATA:  Diffuse abdominal pain.  Vomiting.  EXAM: CT ABDOMEN AND PELVIS WITH CONTRAST  TECHNIQUE: Multidetector CT imaging of the abdomen and pelvis was performed using the standard protocol following bolus administration of intravenous contrast.  CONTRAST:  100mL OMNIPAQUE IOHEXOL 300 MG/ML  SOLN  COMPARISON:  None.  FINDINGS: The patient has acute appendicitis. The appendix is edematous and measures 11 mm in diameter. The cecum lies in the mid pelvis and the appendix is retrocecal and extends superiorly and laterally. There is no perforation. Terminal ileum is normal.  Liver, biliary tree, spleen, pancreas, adrenal glands, and kidneys demonstrate no significant abnormality. 5 mm low-density lesion in the upper pole of the left kidney most likely represents a tiny cyst.  IMPRESSION: Acute appendicitis. Critical Value/emergent results were called by telephone at the time of interpretation on 10/23/2013 at 7:55 PM to Ms. Gershon Mussel'Malley, [place], who verbally acknowledged these results.   Electronically Signed   By: Geanie CooleyJim  Maxwell M.D.   On: 10/23/2013 19:56    Procedures Dr. Janee Mornhompson (10/23/13) - Laparoscopic Appendectomy  Hospital Course:  33 y/o female developed vague lower abdominal pain on the morning of 10/22/13. She thought it was associated with gas. She had mild anorexia. She was seen at her primary care office and some laboratory studies were drawn. She initially felt a little better. The pain came back worse today and her doctor's office called and told her she had an elevated  white count. They directed her to the emergency room for further workup. We put to count here is normal, however, CT scan of the abdomen and pelvis was done showing acute appendicitis. She continues to have right lower quadrant pain.  Workup showed acute appendicitis with normal WBC.  Patient was admitted and underwent procedure listed above.  Tolerated procedure well and was transferred to the floor.  Diet was advanced as tolerated.  On POD #1, the patient was voiding well, tolerating diet, ambulating well, pain well controlled, vital signs stable, incisions c/d/i and felt stable for discharge home.  Patient will follow up in our office in 3 weeks and knows to call with questions or concerns.      Medication List         CALCIUM + D PO  Take 1 tablet by mouth daily.     ferrous sulfate 325 (65 FE) MG tablet  Take 325 mg by mouth daily with breakfast.     ibuprofen 200 MG tablet  Commonly known as:  ADVIL,MOTRIN  Take 400 mg by mouth every 6 (six) hours as needed for fever.     multivitamin with minerals Tabs tablet  Take 1 tablet by mouth daily.     oxyCODONE-acetaminophen 5-325 MG per tablet  Commonly known as:  PERCOCET/ROXICET  Take 1-2 tablets by mouth every 6 (six) hours as needed for moderate pain.     promethazine 25 MG suppository  Commonly known as:  PHENERGAN  Place 1 suppository (25 mg total) rectally every 6 (six) hours as needed for nausea.     promethazine 25 MG tablet  Commonly known as:  PHENERGAN  Take 1  tablet (25 mg total) by mouth every 6 (six) hours as needed for nausea.     SEASONIQUE PO  Take 1 tablet by mouth daily.         Follow-up Information   Follow up with [facility] On 11/13/2013. (For post-operation check on the 11/13/13 at 2:45pm, please arrive 30 mintues early to check in and fill out paperwork)    Contact information:   [address]   [place] [address] [phone]       Signed: Candiss NorseMegan Dort,  PA-[facility] [phone]  10/24/2013, 9:04 AM

## 2013-10-24 NOTE — Progress Notes (Signed)
Pt/family given discharge instructions, medication lists, follow up appointments, and when to call the doctor.  Pt/family verbalizes understanding. Pt given signs and symptoms of infection. Called [facility] for letter for work. Thomas HoffSandra McClintock Bobak Oguinn, RN

## 2013-10-24 NOTE — Anesthesia Postprocedure Evaluation (Signed)
Anesthesia Post Note  Patient: Kari Hendricks  Procedure(s) Performed: Procedure(s) (LRB): APPENDECTOMY LAPAROSCOPIC (N/A)  Anesthesia type: general  Patient location: PACU  Post pain: Pain level controlled  Post assessment: Patient's Cardiovascular Status Stable  Last Vitals:  Filed Vitals:   10/24/13 0510  BP: 127/63  Pulse: 62  Temp: 37 C  Resp: 18    Post vital signs: Reviewed and stable  Level of consciousness: sedated  Complications: No apparent anesthesia complications

## 2013-10-24 NOTE — Progress Notes (Signed)
1 Day Post-Op  Subjective: PT doing well this AM.  No c/o.    Objective: Vital signs in last 24 hours: Temp:  [98 F (36.7 C)-99.5 F (37.5 C)] 98.6 F (37 C) (05/20 0510) Pulse Rate:  [62-91] 62 (05/20 0510) Resp:  [16-18] 18 (05/20 0510) BP: (113-143)/(63-91) 127/63 mmHg (05/20 0510) SpO2:  [99 %-100 %] 100 % (05/20 0510) Weight:  [125 lb (56.7 kg)-131 lb 13.4 oz (59.8 kg)] 131 lb 13.4 oz (59.8 kg) (05/20 0505) Last BM Date: 10/22/13  Intake/Output from previous day: 05/19 0701 - 05/20 0700 In: 1160 [P.O.:360; I.V.:800] Out: 650 [Urine:650] Intake/Output this shift:    General appearance: alert and cooperative Cardio: regular rate and rhythm, S1, S2 normal, no murmur, click, rub or gallop GI: s/approp ttp, ND, wound c/d/i  Lab Results:   Recent Labs  10/23/13 1500  WBC 7.2  HGB 12.6  HCT 38.1  PLT 251   BMET  Recent Labs  10/23/13 1500  NA 141  K 4.0  CL 102  CO2 27  GLUCOSE 111*  BUN 10  CREATININE 0.72  CALCIUM 9.4   PT/INR No results found for this basename: LABPROT, INR,  in the last 72 hours ABG No results found for this basename: PHART, PCO2, PO2, HCO3,  in the last 72 hours  Studies/Results: Ct Abdomen Pelvis W Contrast  10/23/2013   CLINICAL DATA:  Diffuse abdominal pain.  Vomiting.  EXAM: CT ABDOMEN AND PELVIS WITH CONTRAST  TECHNIQUE: Multidetector CT imaging of the abdomen and pelvis was performed using the standard protocol following bolus administration of intravenous contrast.  CONTRAST:  100mL OMNIPAQUE IOHEXOL 300 MG/ML  SOLN  COMPARISON:  None.  FINDINGS: The patient has acute appendicitis. The appendix is edematous and measures 11 mm in diameter. The cecum lies in the mid pelvis and the appendix is retrocecal and extends superiorly and laterally. There is no perforation. Terminal ileum is normal.  Liver, biliary tree, spleen, pancreas, adrenal glands, and kidneys demonstrate no significant abnormality. 5 mm low-density lesion in the upper  pole of the left kidney most likely represents a tiny cyst.  IMPRESSION: Acute appendicitis. Critical Value/emergent results were called by telephone at the time of interpretation on 10/23/2013 at 7:55 PM to Ms. Gershon Mussel'Malley, [place], who verbally acknowledged these results.   Electronically Signed   By: Geanie CooleyJim  Maxwell M.D.   On: 10/23/2013 19:56    Anti-infectives: Anti-infectives   Start     Dose/Rate Route Frequency Ordered Stop   10/23/13 2130  metroNIDAZOLE (FLAGYL) IVPB 500 mg     500 mg 100 mL/hr over 60 Minutes Intravenous  Once 10/23/13 2129 10/23/13 2219   10/23/13 2130  cefTRIAXone (ROCEPHIN) 1 g in dextrose 5 % 50 mL IVPB     1 g 100 mL/hr over 30 Minutes Intravenous  Once 10/23/13 2129 10/23/13 2216      Assessment/Plan: s/p Procedure(s): APPENDECTOMY LAPAROSCOPIC (N/A) Advance diet Home today if tol PO well F/u in 2 weeks   LOS: 1 day    Axel Fillerrmando Nieve Rojero 10/24/2013

## 2013-10-24 NOTE — Discharge Instructions (Signed)
[facility] ______[facility] °LAPAROSCOPIC SURGERY: POST OP INSTRUCTIONS °Always review your discharge instruction sheet given to you by the facility where your surgery was performed. °IF YOU HAVE DISABILITY OR FAMILY LEAVE FORMS, YOU MUST BRING THEM TO THE OFFICE FOR PROCESSING.   °DO NOT GIVE THEM TO YOUR DOCTOR. ° °1. A prescription for pain medication may be given to you upon discharge.  Take your pain medication as prescribed, if needed.  If narcotic pain medicine is not needed, then you may take acetaminophen (Tylenol) or ibuprofen (Advil) as needed. °2. Take your usually prescribed medications unless otherwise directed. °3. If you need a refill on your pain medication, please contact your pharmacy.  They will contact our office to request authorization. Prescriptions will not be filled after 5pm or on week-ends. °4. You should follow a light diet the first few days after arrival home, such as soup and crackers, etc.  Be sure to include lots of fluids daily. °5. Most patients will experience some swelling and bruising in the area of the incisions.  Ice packs will help.  Swelling and bruising can take several days to resolve.  °6. It is common to experience some constipation if taking pain medication after surgery.  Increasing fluid intake and taking a stool softener (such as Colace) will usually help or prevent this problem from occurring.  A mild laxative (Milk of Magnesia or Miralax) should be taken according to package instructions if there are no bowel movements after 48 hours. °7. Unless discharge instructions indicate otherwise, you may remove your bandages 24-48 hours after surgery, and you may shower at that time.  You may have steri-strips (small skin tapes) in place directly over the incision.  These strips should be left on the skin for 7-10 days.  If your surgeon used skin glue on the incision, you may shower in 24 hours.  The glue will flake off over the next 2-3 weeks.  Any sutures or  staples will be removed at the office during your follow-up visit. °8. ACTIVITIES:  You may resume regular (light) daily activities beginning the next day--such as daily self-care, walking, climbing stairs--gradually increasing activities as tolerated.  You may have sexual intercourse when it is comfortable.  Refrain from any heavy lifting or straining until approved by your doctor. °a. You may drive when you are no longer taking prescription pain medication, you can comfortably wear a seatbelt, and you can safely maneuver your car and apply brakes. °b. RETURN TO WORK:  __________________________________________________________ °9. You should see your doctor in the office for a follow-up appointment approximately 2-3 weeks after your surgery.  Make sure that you call for this appointment within a day or two after you arrive home to insure a convenient appointment time. °10. OTHER INSTRUCTIONS: __________________________________________________________________________________________________________________________ __________________________________________________________________________________________________________________________ °WHEN TO CALL YOUR DOCTOR: °1. Fever over 101.0 °2. Inability to urinate °3. Continued bleeding from incision. °4. Increased pain, redness, or drainage from the incision. °5. Increasing abdominal pain ° °The clinic staff is available to answer your questions during regular business hours.  Please don’t hesitate to call and ask to speak to one of the nurses for clinical concerns.  If you have a medical emergency, go to the nearest emergency room or call 911.  A surgeon from [facility] is always on call at the hospital. °[address], [address], [place], [place]  [address] ? [address] [address], [place], [place]   [address] °[phone] ? [phone] ? FAX [phone] °Web site:   [email or web address] °

## 2013-10-25 NOTE — ED Provider Notes (Signed)
Medical screening examination/treatment/procedure(s) were performed by non-physician practitioner and as supervising physician I was immediately available for consultation/collaboration.      Laray AngerKathleen M Leandre Wien, DO 10/25/13 2137

## 2013-10-26 ENCOUNTER — Encounter (HOSPITAL_COMMUNITY): Payer: Self-pay | Admitting: General Surgery

## 2013-11-01 ENCOUNTER — Telehealth (INDEPENDENT_AMBULATORY_CARE_PROVIDER_SITE_OTHER): Payer: Self-pay

## 2013-11-01 NOTE — Telephone Encounter (Signed)
Pt s/p lap appy on 10/1913. Pt calling today to make sure that it would be ok for pt to schedule a appt to see her Dentist. Advised the patient unless  she has a cardiac issue that she normally has to take antibiotics before there is no surgical reason she should not go see the dentist. Pt verbalized understanding.

## 2013-11-13 ENCOUNTER — Ambulatory Visit (INDEPENDENT_AMBULATORY_CARE_PROVIDER_SITE_OTHER): Payer: BC Managed Care – PPO | Admitting: General Surgery

## 2013-11-13 ENCOUNTER — Encounter (INDEPENDENT_AMBULATORY_CARE_PROVIDER_SITE_OTHER): Payer: Self-pay

## 2013-11-13 VITALS — BP 122/80 | HR 120 | Temp 97.5°F | Ht 64.0 in | Wt 128.0 lb

## 2013-11-13 DIAGNOSIS — K3580 Unspecified acute appendicitis: Secondary | ICD-10-CM

## 2013-11-13 NOTE — Patient Instructions (Signed)
Keep incisions clean and dry.  Resume activity as tolerated.

## 2013-11-13 NOTE — Progress Notes (Signed)
GENISYS MACKALL POPE STALLS 01-27-1981 [id number] 11/13/2013   History of Present Illness: Kari Hendricks is a  33 y.o. female who presents today status post lap appy by Dr. Violeta Gelinas.  Pathology reveals  Appendix, Other than Incidental - ACUTE APPENDICITIS. - NO TUMOR SEEN..   The patient is tolerating a regular diet, having normal bowel movements, has good pain control.  She  is back to most normal activities.   Physical Exam: Abd: soft, nontender, active bowel sounds, nondistended.  All incisions are well healed. BP 122/80  Pulse 120  Temp(Src) 97.5 F (36.4 C)  Ht 5\' 4"  (1.626 m)  Wt 58.06 kg (128 lb)  BMI 21.96 kg/m2  LMP 10/16/2013  Impression: 1.  Acute appendicitis, s/p lap appy  Plan: She  is able to return to normal activities. She  may follow up on a prn basis.

## 2014-12-24 ENCOUNTER — Other Ambulatory Visit: Payer: Self-pay | Admitting: Family Medicine

## 2014-12-24 DIAGNOSIS — R140 Abdominal distension (gaseous): Secondary | ICD-10-CM

## 2014-12-24 DIAGNOSIS — R1030 Lower abdominal pain, unspecified: Secondary | ICD-10-CM

## 2014-12-30 ENCOUNTER — Ambulatory Visit
Admission: RE | Admit: 2014-12-30 | Discharge: 2014-12-30 | Disposition: A | Discharge status: Home / Self Care | Payer: BLUE CROSS/BLUE SHIELD | Source: Ambulatory Visit | Attending: Family Medicine | Admitting: Family Medicine

## 2014-12-30 DIAGNOSIS — R140 Abdominal distension (gaseous): Secondary | ICD-10-CM

## 2014-12-30 DIAGNOSIS — R1030 Lower abdominal pain, unspecified: Secondary | ICD-10-CM

## 2015-07-07 ENCOUNTER — Telehealth: Payer: Self-pay | Admitting: Genetic Counselor

## 2015-07-07 NOTE — Telephone Encounter (Signed)
[facility] MESS REGARDING GENETIC TESTING REFERRAL

## 2015-07-28 ENCOUNTER — Other Ambulatory Visit: Payer: BLUE CROSS/BLUE SHIELD

## 2015-07-28 ENCOUNTER — Encounter: Payer: Self-pay | Admitting: Genetic Counselor

## 2015-07-28 ENCOUNTER — Ambulatory Visit (HOSPITAL_BASED_OUTPATIENT_CLINIC_OR_DEPARTMENT_OTHER): Payer: BLUE CROSS/BLUE SHIELD | Admitting: Genetic Counselor

## 2015-07-28 DIAGNOSIS — Z803 Family history of malignant neoplasm of breast: Secondary | ICD-10-CM | POA: Diagnosis not present

## 2015-07-28 DIAGNOSIS — Z315 Encounter for genetic counseling: Secondary | ICD-10-CM | POA: Diagnosis not present

## 2015-07-28 NOTE — Progress Notes (Signed)
REFERRING PROVIDER: Juanda Chance, NP [address] [place] [address] [place], [place] [address]  PRIMARY PROVIDER:  Default, Provider, MD  PRIMARY REASON FOR VISIT:  1. Family history of breast cancer      HISTORY OF PRESENT ILLNESS:   Kari Hendricks, a 35 y.o. female, was seen for a [facility] cancer genetics consultation at the request of Nurse Practitioner Kari Hendricks due to a family history of cancer.  Kari Hendricks presents to clinic today to discuss the possibility of a hereditary predisposition to cancer, genetic testing, and to further clarify her future cancer risks, as well as potential cancer risks for family members. Kari Hendricks is a 35 y.o. female with no personal history of cancer.  She is concerned about her family history of breast cancer and is interested in learning more about genetic testing.  CANCER HISTORY:   No history exists.     HORMONAL RISK FACTORS:  Menarche was at age 53.  First live birth at age N/A.  OCP use for approximately 10 years.  Ovaries intact: yes.  Hysterectomy: no.  Menopausal status: premenopausal.  HRT use: 0 years. Colonoscopy: no; not examined. Mammogram within the last year: no. Number of breast biopsies: 0. Up to date with pelvic exams:  yes. Any excessive radiation exposure in the past:  no  Past Medical History  Diagnosis Date  . Anemia   . Asthma   . Family history of breast cancer     Past Surgical History  Procedure Laterality Date  . Laparoscopic appendectomy N/A 10/23/2013    Procedure: APPENDECTOMY LAPAROSCOPIC;  Surgeon: Zenovia Jarred, MD;  Location: [facility];  Service: General;  Laterality: N/A;    Social History   Social History  . Marital Status: Married    Spouse Name: N/A  . Number of Children: 0  . Years of Education: N/A   Social History Main Topics  . Smoking status: Never Smoker   . Smokeless tobacco: None  . Alcohol Use: Yes  . Drug Use: No  . Sexual Activity: Yes    Birth Control/  Protection: Pill   Other Topics Concern  . None   Social History Narrative     FAMILY HISTORY:  We obtained a detailed, 4-generation family history.  Significant diagnoses are listed below: Family History  Problem Relation Age of Onset  . Hypertension Father   . Bone cancer Maternal Uncle 17  . Breast cancer Maternal Grandmother     died in her 64s, unsure of age of dx  . Heart attack Maternal Grandfather   . Kidney cancer Paternal Grandfather     The patient does not have any children.  She has one brother and one sister who are healthy and cancer free.  Her parents are both living in their late 71s and are cancer free.  Her mother had one brother who died at age 8 from bone cancer.  The patient's maternal grandmother had breast cancer, she thinks, in her 52s and died.  The patient's father has one sister who is cancer free.  His father died of kidney cancer in his 5s, and his mother is alive and cancer free. Patient's maternal ancestors are of [place] descent, and paternal ancestors are of [place] descent. There is no reported Ashkenazi Jewish ancestry. There is no known consanguinity.  GENETIC COUNSELING ASSESSMENT: Kari Hendricks is a 35 y.o. female with a family history of breast cancer which is somewhat suggestive of a familial or sporadic  family history, but not a high predisposition to cancer. We, therefore, discussed and recommended the following at today's visit.   DISCUSSION: We discussed that only about 5-10% of breast cancer is hereditary, with the majority of cases being sporadic or familial.  We reviewed the characteristics, features and inheritance patterns of hereditary cancer syndromes. Based on her family history of cancer, we discussed with Kari Hendricks that the family history is not highly consistent with a familial hereditary cancer syndrome, and we feel she is at low risk to harbor a gene mutation associated with such a condition. Thus, we did not recommend any  genetic testing, at this time, and recommended Kari Hendricks continue to follow the cancer screening guidelines given by her primary healthcare provider.  In order to estimate her Hendricks of having a BRCA mutation, we used statistical models (PENN II and Tyrer Cusik) and laboratory data that take into account her personal medical history, family history and ancestry.  Because each model is different, there can be a lot of variability in the risks they give.  Therefore, these numbers must be considered a rough range and not a precise risk of having a BRCA mutation.  These models estimate that she has approximately a 0.25-1% Hendricks of having a mutation. Based on this assessment of her family and personal history, genetic testing is not recommended.  Based on the patient's personal and family history, statistical models (Tyrer Cusik)  and literature data were used to estimate her risk of developing breast cancer. This estimates her lifetime risk of developing breast cancer to be approximately 18.5%. This estimation does not take into account any genetic testing results.  The patient's lifetime breast cancer risk is a preliminary estimate based on available information using one of several models endorsed by the [place] (ACS). The ACS recommends consideration of breast MRI screening as an adjunct to mammography for patients at high risk (defined as 20% or greater lifetime risk). A more detailed breast cancer risk assessment can be considered, if clinically indicated.  Even though Kari Hendricks does not meet medical criteria, genetic testing is still possible by paying out of pocket.  There are currently a couple lower cost tests that could be utilized through either [facility] at $249 or [facility] at $475.  If Kari Hendricks would like to pay out of pocket and pursue genetic testing we would be happy to do this for her.   PLAN:   We encouraged Kari Hendricks to remain in contact with cancer genetics  annually so that we can continuously update the family history and inform her of any changes in cancer genetics and testing that may be of benefit for this family.   Ms.  Kari Hendricks questions were answered to her satisfaction today. Our contact information was provided should additional questions or concerns arise. Thank you for the referral and allowing [place] to share in the care of your patient.   Rishaan Gunner P. Florene Glen, [place], [facility] Certified Genetic Counselor [email or web address] phone: [phone]  The patient was seen for a total of 45 minutes in face-to-face genetic counseling.  This patient was discussed with Drs. Magrinat, Lindi Adie and/or Burr Medico who agrees with the above.    _______________________________________________________________________ For Office Staff:  Number of people involved in session: 1 Was an Intern/ student involved with case: yes

## 2016-07-17 ENCOUNTER — Encounter (HOSPITAL_COMMUNITY): Payer: Self-pay

## 2016-07-17 ENCOUNTER — Inpatient Hospital Stay (HOSPITAL_COMMUNITY)
Admission: AD | Admit: 2016-07-17 | Discharge: 2016-07-17 | Disposition: A | Discharge status: Home / Self Care | Payer: BLUE CROSS/BLUE SHIELD | Source: Ambulatory Visit | Attending: Obstetrics and Gynecology | Admitting: Obstetrics and Gynecology

## 2016-07-17 DIAGNOSIS — O039 Complete or unspecified spontaneous abortion without complication: Secondary | ICD-10-CM | POA: Diagnosis not present

## 2016-07-17 LAB — ABO/RH: ABO/RH(D): O NEG

## 2016-07-17 MED ORDER — RHO D IMMUNE GLOBULIN 1500 UNIT/2ML IJ SOSY
300.0000 ug | PREFILLED_SYRINGE | Freq: Once | INTRAMUSCULAR | Status: AC
  Administered 2016-07-17: 300 ug via INTRAMUSCULAR
  Filled 2016-07-17: qty 2

## 2016-07-17 NOTE — MAU Note (Signed)
Patient presents for Rhogam injection s/p miscarriage, here to receive Rhogam per Dr. Langston MaskerMorris, per Dr. Rana SnareLowe patient may go to office on Monday for injection, or received antibody screen and injection here, patient given the option chose to received in MAU.

## 2016-07-17 NOTE — MAU Note (Signed)
Patient was seen by [facility], was told having a miscarriage was given medication to induce, was to come in on Saturday for Rhogam injection due to their blood types.  Still having vaginal bleeding not as heavy, was having moderate to severe cramping, none since upon awakening.

## 2016-07-18 LAB — RH IG WORKUP (INCLUDES ABO/RH)
ABO/RH(D): O NEG
Antibody Screen: NEGATIVE
GESTATIONAL AGE(WKS): 6
Unit division: 0

## 2017-05-14 ENCOUNTER — Encounter (HOSPITAL_COMMUNITY): Payer: Self-pay

## 2017-05-17 DIAGNOSIS — N911 Secondary amenorrhea: Secondary | ICD-10-CM | POA: Diagnosis not present

## 2017-05-27 DIAGNOSIS — Z3685 Encounter for antenatal screening for Streptococcus B: Secondary | ICD-10-CM | POA: Diagnosis not present

## 2017-05-27 DIAGNOSIS — Z3401 Encounter for supervision of normal first pregnancy, first trimester: Secondary | ICD-10-CM | POA: Diagnosis not present

## 2017-05-27 LAB — OB RESULTS CONSOLE ANTIBODY SCREEN: Antibody Screen: NEGATIVE

## 2017-05-27 LAB — OB RESULTS CONSOLE RPR: RPR: NONREACTIVE

## 2017-05-27 LAB — OB RESULTS CONSOLE HIV ANTIBODY (ROUTINE TESTING): HIV: NONREACTIVE

## 2017-05-27 LAB — OB RESULTS CONSOLE GC/CHLAMYDIA
Chlamydia: NEGATIVE
Gonorrhea: NEGATIVE

## 2017-05-27 LAB — OB RESULTS CONSOLE ABO/RH: RH Type: NEGATIVE

## 2017-05-27 LAB — OB RESULTS CONSOLE HEPATITIS B SURFACE ANTIGEN: Hepatitis B Surface Ag: NEGATIVE

## 2017-05-27 LAB — OB RESULTS CONSOLE RUBELLA ANTIBODY, IGM: Rubella: IMMUNE

## 2017-06-07 NOTE — L&D Delivery Note (Signed)
Delivery Note At 4:50 AM a viable female was delivered via OA Presentation  APGAR: 9 9   Placenta status:spontaneously with 3 vessel cord , .  Cord:  with the following complications: nuchal cord x 1 .  Cord pH: not obtained  Anesthesia:  nitrous Episiotomy:  none Lacerations:  2nd Suture Repair: 3.0 chromic Est. Blood Loss (mL):  300  Mom to postpartum.  Baby to Couplet care / Skin to Skin.  Antoria Lanza L 01/06/2018, 5:06 AM

## 2017-06-08 DIAGNOSIS — Z113 Encounter for screening for infections with a predominantly sexual mode of transmission: Secondary | ICD-10-CM | POA: Diagnosis not present

## 2017-06-08 DIAGNOSIS — Z3401 Encounter for supervision of normal first pregnancy, first trimester: Secondary | ICD-10-CM | POA: Diagnosis not present

## 2017-06-23 DIAGNOSIS — O09521 Supervision of elderly multigravida, first trimester: Secondary | ICD-10-CM | POA: Diagnosis not present

## 2017-06-23 DIAGNOSIS — Z3682 Encounter for antenatal screening for nuchal translucency: Secondary | ICD-10-CM | POA: Diagnosis not present

## 2017-06-23 DIAGNOSIS — Z13228 Encounter for screening for other metabolic disorders: Secondary | ICD-10-CM | POA: Diagnosis not present

## 2017-08-02 DIAGNOSIS — Z363 Encounter for antenatal screening for malformations: Secondary | ICD-10-CM | POA: Diagnosis not present

## 2017-08-02 DIAGNOSIS — Z3480 Encounter for supervision of other normal pregnancy, unspecified trimester: Secondary | ICD-10-CM | POA: Diagnosis not present

## 2017-08-02 DIAGNOSIS — Z3A18 18 weeks gestation of pregnancy: Secondary | ICD-10-CM | POA: Diagnosis not present

## 2017-08-18 ENCOUNTER — Emergency Department (HOSPITAL_BASED_OUTPATIENT_CLINIC_OR_DEPARTMENT_OTHER)
Admit: 2017-08-18 | Discharge: 2017-08-18 | Disposition: A | Discharge status: Home / Self Care | Payer: BLUE CROSS/BLUE SHIELD | Attending: Emergency Medicine | Admitting: Emergency Medicine

## 2017-08-18 ENCOUNTER — Encounter (HOSPITAL_COMMUNITY): Payer: Self-pay | Admitting: Emergency Medicine

## 2017-08-18 ENCOUNTER — Other Ambulatory Visit: Payer: Self-pay

## 2017-08-18 ENCOUNTER — Emergency Department (HOSPITAL_COMMUNITY)
Admission: EM | Admit: 2017-08-18 | Discharge: 2017-08-18 | Disposition: A | Discharge status: Home / Self Care | Payer: BLUE CROSS/BLUE SHIELD | Attending: Emergency Medicine | Admitting: Emergency Medicine

## 2017-08-18 DIAGNOSIS — Z79899 Other long term (current) drug therapy: Secondary | ICD-10-CM | POA: Insufficient documentation

## 2017-08-18 DIAGNOSIS — O1202 Gestational edema, second trimester: Secondary | ICD-10-CM | POA: Diagnosis not present

## 2017-08-18 DIAGNOSIS — M7989 Other specified soft tissue disorders: Secondary | ICD-10-CM

## 2017-08-18 DIAGNOSIS — O99012 Anemia complicating pregnancy, second trimester: Secondary | ICD-10-CM | POA: Diagnosis not present

## 2017-08-18 DIAGNOSIS — O9952 Diseases of the respiratory system complicating childbirth: Secondary | ICD-10-CM | POA: Insufficient documentation

## 2017-08-18 DIAGNOSIS — J45909 Unspecified asthma, uncomplicated: Secondary | ICD-10-CM | POA: Insufficient documentation

## 2017-08-18 DIAGNOSIS — R740 Nonspecific elevation of levels of transaminase and lactic acid dehydrogenase [LDH]: Secondary | ICD-10-CM | POA: Diagnosis not present

## 2017-08-18 DIAGNOSIS — Z3A21 21 weeks gestation of pregnancy: Secondary | ICD-10-CM | POA: Insufficient documentation

## 2017-08-18 DIAGNOSIS — O9989 Other specified diseases and conditions complicating pregnancy, childbirth and the puerperium: Secondary | ICD-10-CM | POA: Diagnosis not present

## 2017-08-18 HISTORY — DX: Unspecified appendicitis: K37

## 2017-08-18 LAB — BASIC METABOLIC PANEL
Anion gap: 9 (ref 5–15)
BUN: 10 mg/dL (ref 6–20)
CALCIUM: 8.9 mg/dL (ref 8.9–10.3)
CO2: 23 mmol/L (ref 22–32)
Chloride: 104 mmol/L (ref 101–111)
Creatinine, Ser: 0.57 mg/dL (ref 0.44–1.00)
GFR calc Af Amer: >60 mL/min (ref >60)
Glucose, Bld: 101 mg/dL — ABNORMAL HIGH (ref 65–99)
POTASSIUM: 4.2 mmol/L (ref 3.5–5.1)
Sodium: 136 mmol/L (ref 135–145)

## 2017-08-18 LAB — CBC
HCT: 35.8 % — ABNORMAL LOW (ref 36.0–46.0)
Hemoglobin: 11.4 g/dL — ABNORMAL LOW (ref 12.0–15.0)
MCH: 26.3 pg (ref 26.0–34.0)
MCHC: 31.8 g/dL (ref 30.0–36.0)
MCV: 82.5 fL (ref 78.0–100.0)
PLATELETS: 262 10*3/uL (ref 150–400)
RBC: 4.34 MIL/uL (ref 3.87–5.11)
RDW: 13.7 % (ref 11.5–15.5)
WBC: 9.7 10*3/uL (ref 4.0–10.5)

## 2017-08-18 LAB — D-DIMER, QUANTITATIVE (NOT AT ARMC): D DIMER QUANT: 0.64 ug{FEU}/mL — AB (ref 0.00–0.50)

## 2017-08-18 NOTE — Progress Notes (Signed)
Right lower extremity venous duplex has been completed. Negative for DVT. [facility].  08/18/17 8:22 AM Olen CordialGreg Libia Fazzini RVT

## 2017-08-18 NOTE — ED Triage Notes (Signed)
Pt noticed her R foot was swollen onset earlier this evening, pt called her OB and they told her to come to ED for potential blood clot. Pt denies pain, no redness, foot is not warm to touch. Denies CP/SOB.

## 2017-08-18 NOTE — ED Notes (Signed)
ED Provider at bedside. 

## 2017-08-18 NOTE — ED Provider Notes (Signed)
[facility] Provider Note   CSN: [id number] Arrival date & time: 08/18/17  0115     History   Chief Complaint Chief Complaint  Patient presents with  . Foot Swelling    HPI Kari Hendricks is a 37 y.o. female.  The history is provided by the patient and medical records. No language interpreter was used.   Kari Hendricks is a 37 y.o. female currently [redacted] weeks pregnant who presents to the Emergency Department complaining of right foot swelling which she first noticed last night.  She called her OB/GYN, Dr. Langston MaskerMorris, who recommended that she come to ER for evaluation for potential blood clot.  She denies any pain, stating the top of her foot feels "a little tingly".  No open wounds or redness.  No difficulty with range of motion.  She denies any history of similar.  No left-sided swelling.  No chest pain or shortness of breath.  No medications or treatments prior to arrival for symptoms.  Past Medical History:  Diagnosis Date  . Anemia   . Appendicitis   . Asthma   . Family history of breast cancer     Patient Active Problem List   Diagnosis Date Noted  . Family history of breast cancer   . Acute appendicitis 10/24/2013  . S/P laparoscopic appendectomy 10/23/2013    Past Surgical History:  Procedure Laterality Date  . LAPAROSCOPIC APPENDECTOMY N/A 10/23/2013   Procedure: APPENDECTOMY LAPAROSCOPIC;  Surgeon: Liz MaladyBurke E Thompson, MD;  [facility];  Service: General;  Laterality: N/A;    OB History    Gravida Para Term Preterm AB Living   2             SAB TAB Ectopic Multiple Live Births                   Home Medications    Prior to Admission medications   Medication Sig Start Date End Date Taking? Authorizing Provider  Calcium Carbonate-Vitamin D (CALCIUM + D PO) Take 1 tablet by mouth daily.   Yes [provider]  Doxylamine-Pyridoxine (DICLEGIS) 10-10 MG TBEC Take 1-4 tablets by mouth at bedtime.   Yes  [provider]  ferrous sulfate 325 (65 FE) MG tablet Take 325 mg by mouth daily with breakfast.   Yes [provider]  ibuprofen (ADVIL,MOTRIN) 200 MG tablet Take 400 mg by mouth every 6 (six) hours as needed for fever.   Yes [provider]  loratadine (CLARITIN) 10 MG tablet Take 10 mg by mouth daily.   Yes [provider]  Prenatal Vit-Fe Fumarate-FA (PRENATAL MULTIVITAMIN) TABS tablet Take 1 tablet by mouth daily at 12 noon.   Yes [provider]  Probiotic Product (PROBIOTIC PO) Take 1 tablet by mouth daily.   Yes [provider]    Family History Family History  Problem Relation Age of Onset  . Hypertension Father   . Bone cancer Maternal Uncle 17  . Breast cancer Maternal Grandmother        died in her 6350s, unsure of age of dx  . Heart attack Maternal Grandfather   . Kidney cancer Paternal Grandfather     Social History Social History   Tobacco Use  . Smoking status: Never Smoker  . Smokeless tobacco: Never Used  Substance Use Topics  . Alcohol use: Yes  . Drug use: No     Allergies   Patient has no known allergies.  Review of Systems Review of Systems  Respiratory: Negative for shortness of breath.   Cardiovascular: Positive for leg swelling. Negative for chest pain and palpitations.  All other systems reviewed and are negative.    Physical Exam Updated Vital Signs BP 119/76   Pulse 90   Temp 99.4 F (37.4 C) (Oral)   Resp 16   Ht 5\' 4"  (1.626 m)   Wt 68 kg (150 lb)   LMP 03/07/2017   SpO2 99%   Breastfeeding? No   BMI 25.75 kg/m   Physical Exam  Constitutional: She is oriented to person, place, and time. She appears well-developed and well-nourished. No distress.  HENT:  Head: Normocephalic and atraumatic.  Cardiovascular: Normal rate, regular rhythm and normal heart sounds.  No murmur heard. Pulmonary/Chest: Effort normal and breath sounds normal. No respiratory distress. She has no  wheezes. She has no rales.  Abdominal: Soft. She exhibits no distension. There is no tenderness.  Musculoskeletal:  Very small amount of swelling to dorsum of the right foot. No tenderness to palpation. Full ROM without difficulty. 2+ DP. Sensation intact. No erythema, warmth or open wounds.  Neurological: She is alert and oriented to person, place, and time.  Skin: Skin is warm and dry.  Nursing note and vitals reviewed.    ED Treatments / Results  Labs (all labs ordered are listed, but only abnormal results are displayed) Labs Reviewed  CBC - Abnormal; Notable for the following components:      Result Value   Hemoglobin 11.4 (*)    HCT 35.8 (*)    All other components within normal limits  BASIC METABOLIC PANEL - Abnormal; Notable for the following components:   Glucose, Bld 101 (*)    All other components within normal limits  D-DIMER, QUANTITATIVE (NOT AT [facility]) - Abnormal; Notable for the following components:   D-Dimer, Quant 0.64 (*)    All other components within normal limits    EKG  EKG Interpretation None       Radiology No results found.  Procedures Procedures (including critical care time)  Medications Ordered in ED Medications - No data to display   Initial Impression / Assessment and Plan / ED Course  I have reviewed the triage vital signs and the nursing notes.  Pertinent labs & imaging results that were available during my care of the patient were reviewed by me and considered in my medical decision making (see chart for details).    Kari Hendricks is a 37 y.o. female currently [redacted] weeks pregnant who presents to ED for swelling to the right foot which began last night. On exam, patient is afebrile, hemodynamically stable with very mild amount of swelling to the dorsum of the right foot. No calf pain or tenderness. No findings concerning for infectious etiology. Labs obtained in triage and reviewed. D-dimer elevated at .64. Anemia noted - hx of  same and currently on iron therapy. Will obtain vascular ultrasound to assess for DVT.   DVT study negative.  Symptomatic home care instructions including elevation, increasing water intake, decreasing salt intake discussed.  She will follow-up with OB/GYN.  Reasons to return to ER discussed and all questions answered.  Final Clinical Impressions(s) / ED Diagnoses   Final diagnoses:  Foot swelling    ED Discharge Orders    None       Alexxander Kurt, Chase Picket, PA-C 08/18/17 0845    Arby [place], MD 08/28/17 [phone]

## 2017-08-18 NOTE — Discharge Instructions (Signed)
Fortunately, your ultrasound showed no signs of blood clots.   Keep feet elevated, increase water intake, decrease salt intake.  Keep your scheduled appointment with your OB/GYN.  Plan follow-up with OB/GYN sooner if your symptoms worsen.  Return to ER for new or worsening symptoms, any additional concerns.

## 2017-08-18 NOTE — ED Notes (Signed)
Pt is [redacted] wks pregnant, EDD 12/25/17

## 2017-09-22 DIAGNOSIS — D229 Melanocytic nevi, unspecified: Secondary | ICD-10-CM | POA: Diagnosis not present

## 2017-09-22 DIAGNOSIS — L821 Other seborrheic keratosis: Secondary | ICD-10-CM | POA: Diagnosis not present

## 2017-09-28 DIAGNOSIS — Z3480 Encounter for supervision of other normal pregnancy, unspecified trimester: Secondary | ICD-10-CM | POA: Diagnosis not present

## 2017-09-28 DIAGNOSIS — Z23 Encounter for immunization: Secondary | ICD-10-CM | POA: Diagnosis not present

## 2017-09-28 DIAGNOSIS — O360920 Maternal care for other rhesus isoimmunization, second trimester, not applicable or unspecified: Secondary | ICD-10-CM | POA: Diagnosis not present

## 2017-09-28 DIAGNOSIS — Z3A26 26 weeks gestation of pregnancy: Secondary | ICD-10-CM | POA: Diagnosis not present

## 2017-10-06 DIAGNOSIS — Z3A27 27 weeks gestation of pregnancy: Secondary | ICD-10-CM | POA: Diagnosis not present

## 2017-10-06 DIAGNOSIS — O99810 Abnormal glucose complicating pregnancy: Secondary | ICD-10-CM | POA: Diagnosis not present

## 2017-10-14 DIAGNOSIS — O4402 Placenta previa specified as without hemorrhage, second trimester: Secondary | ICD-10-CM | POA: Diagnosis not present

## 2017-10-14 DIAGNOSIS — Z3A28 28 weeks gestation of pregnancy: Secondary | ICD-10-CM | POA: Diagnosis not present

## 2017-12-07 DIAGNOSIS — Z3480 Encounter for supervision of other normal pregnancy, unspecified trimester: Secondary | ICD-10-CM | POA: Diagnosis not present

## 2017-12-07 DIAGNOSIS — O3663X0 Maternal care for excessive fetal growth, third trimester, not applicable or unspecified: Secondary | ICD-10-CM | POA: Diagnosis not present

## 2017-12-07 DIAGNOSIS — Z3A36 36 weeks gestation of pregnancy: Secondary | ICD-10-CM | POA: Diagnosis not present

## 2017-12-28 ENCOUNTER — Encounter (HOSPITAL_COMMUNITY): Payer: Self-pay | Admitting: *Deleted

## 2017-12-28 ENCOUNTER — Telehealth (HOSPITAL_COMMUNITY): Payer: Self-pay | Admitting: *Deleted

## 2017-12-28 LAB — OB RESULTS CONSOLE GBS: STREP GROUP B AG: NEGATIVE

## 2017-12-28 NOTE — Telephone Encounter (Signed)
Preadmission screen  

## 2018-01-06 ENCOUNTER — Inpatient Hospital Stay (HOSPITAL_COMMUNITY)
Admission: AD | Admit: 2018-01-06 | Discharge: 2018-01-08 | DRG: 807 | Disposition: A | Discharge status: Home / Self Care | Payer: BLUE CROSS/BLUE SHIELD | Attending: Obstetrics and Gynecology | Admitting: Obstetrics and Gynecology

## 2018-01-06 ENCOUNTER — Other Ambulatory Visit: Payer: Self-pay

## 2018-01-06 ENCOUNTER — Encounter (HOSPITAL_COMMUNITY): Payer: Self-pay | Admitting: Obstetrics and Gynecology

## 2018-01-06 DIAGNOSIS — Z3483 Encounter for supervision of other normal pregnancy, third trimester: Secondary | ICD-10-CM | POA: Diagnosis not present

## 2018-01-06 DIAGNOSIS — Z3A40 40 weeks gestation of pregnancy: Secondary | ICD-10-CM

## 2018-01-06 LAB — CBC
HCT: 38.7 % (ref 36.0–46.0)
HEMOGLOBIN: 12.8 g/dL (ref 12.0–15.0)
MCH: 26.2 pg (ref 26.0–34.0)
MCHC: 33.1 g/dL (ref 30.0–36.0)
MCV: 79.1 fL (ref 78.0–100.0)
Platelets: 200 10*3/uL (ref 150–400)
RBC: 4.89 MIL/uL (ref 3.87–5.11)
RDW: 14.5 % (ref 11.5–15.5)
WBC: 21.5 10*3/uL — ABNORMAL HIGH (ref 4.0–10.5)

## 2018-01-06 LAB — POCT FERN TEST: POCT Fern Test: POSITIVE

## 2018-01-06 LAB — RPR: RPR: NONREACTIVE

## 2018-01-06 MED ORDER — RHO D IMMUNE GLOBULIN 1500 UNIT/2ML IJ SOSY
300.0000 ug | PREFILLED_SYRINGE | Freq: Once | INTRAMUSCULAR | Status: AC
  Administered 2018-01-07: 300 ug via INTRAMUSCULAR
  Filled 2018-01-06: qty 2

## 2018-01-06 MED ORDER — SIMETHICONE 80 MG PO CHEW: 80.0000 mg | CHEWABLE_TABLET | ORAL | Status: DC | PRN

## 2018-01-06 MED ORDER — MEDROXYPROGESTERONE ACETATE 150 MG/ML IM SUSP: 150.0000 mg | INTRAMUSCULAR | Status: DC | PRN

## 2018-01-06 MED ORDER — COCONUT OIL OIL: 1.0000 "application " | TOPICAL_OIL | Status: DC | PRN

## 2018-01-06 MED ORDER — OXYTOCIN 10 UNIT/ML IJ SOLN
10.0000 [IU] | Freq: Once | INTRAMUSCULAR | Status: AC
  Administered 2018-01-06: 10 [IU] via INTRAMUSCULAR
  Filled 2018-01-06: qty 1

## 2018-01-06 MED ORDER — WITCH HAZEL-GLYCERIN EX PADS: 1.0000 "application " | MEDICATED_PAD | CUTANEOUS | Status: DC | PRN

## 2018-01-06 MED ORDER — OXYCODONE-ACETAMINOPHEN 5-325 MG PO TABS: 2.0000 | ORAL_TABLET | ORAL | Status: DC | PRN

## 2018-01-06 MED ORDER — TETANUS-DIPHTH-ACELL PERTUSSIS 5-2.5-18.5 LF-MCG/0.5 IM SUSP: 0.5000 mL | Freq: Once | INTRAMUSCULAR | Status: DC

## 2018-01-06 MED ORDER — BISACODYL 10 MG RE SUPP: 10.0000 mg | Freq: Every day | RECTAL | Status: DC | PRN

## 2018-01-06 MED ORDER — BENZOCAINE-MENTHOL 20-0.5 % EX AERO
1.0000 "application " | INHALATION_SPRAY | CUTANEOUS | Status: DC | PRN
  Filled 2018-01-06: qty 56

## 2018-01-06 MED ORDER — DIPHENHYDRAMINE HCL 25 MG PO CAPS: 25.0000 mg | ORAL_CAPSULE | Freq: Four times a day (QID) | ORAL | Status: DC | PRN

## 2018-01-06 MED ORDER — LACTATED RINGERS IV SOLN: 500.0000 mL | INTRAVENOUS | Status: DC | PRN

## 2018-01-06 MED ORDER — FLEET ENEMA 7-19 GM/118ML RE ENEM: 1.0000 | ENEMA | Freq: Every day | RECTAL | Status: DC | PRN

## 2018-01-06 MED ORDER — MEASLES, MUMPS & RUBELLA VAC ~~LOC~~ INJ: 0.5000 mL | INJECTION | Freq: Once | SUBCUTANEOUS | Status: DC

## 2018-01-06 MED ORDER — ONDANSETRON HCL 4 MG/2ML IJ SOLN: 4.0000 mg | INTRAMUSCULAR | Status: DC | PRN

## 2018-01-06 MED ORDER — SOD CITRATE-CITRIC ACID 500-334 MG/5ML PO SOLN: 30.0000 mL | ORAL | Status: DC | PRN

## 2018-01-06 MED ORDER — LIDOCAINE HCL (PF) 1 % IJ SOLN
30.0000 mL | INTRAMUSCULAR | Status: DC | PRN
  Filled 2018-01-06: qty 30

## 2018-01-06 MED ORDER — ACETAMINOPHEN 325 MG PO TABS: 650.0000 mg | ORAL_TABLET | ORAL | Status: DC | PRN

## 2018-01-06 MED ORDER — ONDANSETRON HCL 4 MG/2ML IJ SOLN: 4.0000 mg | Freq: Four times a day (QID) | INTRAMUSCULAR | Status: DC | PRN

## 2018-01-06 MED ORDER — ZOLPIDEM TARTRATE 5 MG PO TABS: 5.0000 mg | ORAL_TABLET | Freq: Every evening | ORAL | Status: DC | PRN

## 2018-01-06 MED ORDER — ACETAMINOPHEN 325 MG PO TABS
650.0000 mg | ORAL_TABLET | ORAL | Status: DC | PRN
  Administered 2018-01-07 (×2): 650 mg via ORAL
  Filled 2018-01-06 (×2): qty 2

## 2018-01-06 MED ORDER — OXYTOCIN 40 UNITS IN LACTATED RINGERS INFUSION - SIMPLE MED: 2.5000 [IU]/h | INTRAVENOUS | Status: DC

## 2018-01-06 MED ORDER — DIBUCAINE 1 % RE OINT: 1.0000 "application " | TOPICAL_OINTMENT | RECTAL | Status: DC | PRN

## 2018-01-06 MED ORDER — SENNOSIDES-DOCUSATE SODIUM 8.6-50 MG PO TABS
2.0000 | ORAL_TABLET | ORAL | Status: DC
  Administered 2018-01-07 (×2): 2 via ORAL
  Filled 2018-01-06 (×2): qty 2

## 2018-01-06 MED ORDER — ONDANSETRON HCL 4 MG PO TABS: 4.0000 mg | ORAL_TABLET | ORAL | Status: DC | PRN

## 2018-01-06 MED ORDER — OXYCODONE-ACETAMINOPHEN 5-325 MG PO TABS: 1.0000 | ORAL_TABLET | ORAL | Status: DC | PRN

## 2018-01-06 MED ORDER — PRENATAL MULTIVITAMIN CH
1.0000 | ORAL_TABLET | Freq: Every day | ORAL | Status: DC
  Administered 2018-01-06 – 2018-01-08 (×3): 1 via ORAL
  Filled 2018-01-06 (×3): qty 1

## 2018-01-06 MED ORDER — OXYTOCIN BOLUS FROM INFUSION: 500.0000 mL | Freq: Once | INTRAVENOUS | Status: DC

## 2018-01-06 MED ORDER — IBUPROFEN 600 MG PO TABS
600.0000 mg | ORAL_TABLET | Freq: Four times a day (QID) | ORAL | Status: DC
  Administered 2018-01-06 – 2018-01-08 (×9): 600 mg via ORAL
  Filled 2018-01-06 (×9): qty 1

## 2018-01-06 MED ORDER — FLEET ENEMA 7-19 GM/118ML RE ENEM: 1.0000 | ENEMA | RECTAL | Status: DC | PRN

## 2018-01-06 NOTE — H&P (Signed)
Kari Hendricks is a 37 y.o. G 2 P0 at 40 w 6 days presents with SROM and contractions.  OB History    Gravida  2   Para      Term      Preterm      AB      Living        SAB      TAB      Ectopic      Multiple      Live Births             Past Medical History:  Diagnosis Date  . Anemia   . Appendicitis   . Asthma   . Family history of breast cancer    Past Surgical History:  Procedure Laterality Date  . Eye tumor removal     left  . LAPAROSCOPIC APPENDECTOMY N/A 10/23/2013   Procedure: APPENDECTOMY LAPAROSCOPIC;  Surgeon: Liz MaladyBurke E Thompson, MD;  Location: [facility];  Service: General;  Laterality: N/A;  . WISDOM TOOTH EXTRACTION     Family History: family history includes Bone cancer (age of onset: 8617) in her maternal uncle; Breast cancer in her maternal grandmother; Diabetes in her father; Heart attack in her maternal grandfather; Hypertension in her father; Kidney Stones in her mother; Kidney cancer in her paternal grandfather. Social History:  reports that she has never smoked. She has never used smokeless tobacco. She reports that she drinks alcohol. She reports that she does not use drugs.     Maternal Diabetes: No Genetic Screening: Normal Maternal Ultrasounds/Referrals: Normal Fetal Ultrasounds or other Referrals:  None Maternal Substance Abuse:  No Significant Maternal Medications:  None Significant Maternal Lab Results:  None Other Comments:  None  Review of Systems  All other systems reviewed and are negative.  Maternal Medical History:  Reason for admission: Rupture of membranes and contractions.     Dilation: 10 Effacement (%): 100 Station: Plus 1 Exam by:: S Willis RN  Blood pressure 121/77, pulse (!) 57, temperature 99 F (37.2 C), temperature source Oral, resp. rate 20, height 5\' 3"  (1.6 m), weight 77.8 kg (171 lb 8 oz), last menstrual period 03/26/2017, SpO2 100 %. Maternal Exam:  Uterine Assessment: Contraction strength is  moderate.  Contraction frequency is regular.   Abdomen: Fetal presentation: vertex     Fetal Exam Fetal State Assessment: Category I - tracings are normal.     Physical Exam  Nursing note and vitals reviewed. Constitutional: She appears well-developed and well-nourished.  HENT:  Head: Normocephalic.  Eyes: Pupils are equal, round, and reactive to light.  Neck: Normal range of motion.  Cardiovascular: Normal rate and regular rhythm.  Respiratory: Effort normal.  GI: Soft.    Prenatal labs: ABO, Rh: O/Negative/-- (12/21 0000) Antibody: Negative (12/21 0000) Rubella: Immune (12/21 0000) RPR: Nonreactive (12/21 0000)  HBsAg: Negative (12/21 0000)  HIV: Non-reactive (12/21 0000)  GBS: Negative (07/24 0000)   Assessment/Plan: IUP at term Labor  Anticipate NSVD   Kari Hendricks L 01/06/2018, 4:00 AM

## 2018-01-06 NOTE — MAU Note (Signed)
CTX 3-4 minutes apart.  SROM at 0000- reports meconium stained fluid.  + FM.  Also noticed some bleedng in her underwear tonight. GBS -

## 2018-01-06 NOTE — Lactation Note (Signed)
This note was copied from a baby's chart. Lactation Consultation Note  Patient Name: Kari Samuella BruinLauren Pope Stalls QMVHQ'IToday's Date: 01/06/2018 Reason for consult: Initial assessment;Primapara;Term  Referral from RN with assistance for feeding.  Infant cuing and crying and he has not latched since birth.  Mom with flat nipples. At this time, infant does not seem to elevate or extend tongue much.  He is cuing and fussy. Assist with positioning/latch/he opens but is unable to latch.  Used manual harmony pump and able to draw moms nipples out some.  Infant latched and takes a few sucks but does not maintain. He is chompy. Did this a few times. Then hand expressed and fed back EBM from spoon.  Mom able to hand express 5 ml from right breast easily.  Unable to obtain anything on the left with hand expression.  Left him sts with mom. Enc them to offer him a few more drops if he continued to cue. Discussed nipple shield use, Urged her to keep doing what she was doing and hopefully we would be able to get nipples evert more and he would be able to breastfeed well. Gave shells with instruction on use. Gave information on BFSg and breastfeeding resources for once she went home.  Mom reports she took a bf class here .   Maternal Data Has patient been taught Hand Expression?: Yes Does the patient have breastfeeding experience prior to this delivery?: No  Feeding Feeding Type: Breast Fed Length of feed: 10 min  LATCH Score Latch: Repeated attempts needed to sustain latch, nipple held in mouth throughout feeding, stimulation needed to elicit sucking reflex.  Audible Swallowing: A few with stimulation  Type of Nipple: Flat  Comfort (Breast/Nipple): Soft / non-tender  Hold (Positioning): Full assist, staff holds infant at breast  LATCH Score: 5  Interventions Interventions: Breast feeding basics reviewed;Assisted with latch;Skin to skin;Hand express;Pre-pump if needed;Support pillows;Expressed milk;Shells;Hand  pump  Lactation Tools Discussed/Used Tools: Shells;Other (comment)(manual pump to help with nipple eversion) WIC Program: No   Consult Status Consult Status: Follow-up Date: 01/07/18 Follow-up type: In-patient    Isobel Eisenhuth Michaelle CopasS Trevaris Pennella 01/06/2018, 1:26 PM

## 2018-01-07 LAB — CBC
HEMATOCRIT: 30.1 % — AB (ref 36.0–46.0)
HEMOGLOBIN: 9.9 g/dL — AB (ref 12.0–15.0)
MCH: 26.1 pg (ref 26.0–34.0)
MCHC: 32.9 g/dL (ref 30.0–36.0)
MCV: 79.4 fL (ref 78.0–100.0)
Platelets: 169 10*3/uL (ref 150–400)
RBC: 3.79 MIL/uL — ABNORMAL LOW (ref 3.87–5.11)
RDW: 14.8 % (ref 11.5–15.5)
WBC: 15.1 10*3/uL — ABNORMAL HIGH (ref 4.0–10.5)

## 2018-01-07 NOTE — Lactation Note (Signed)
This note was copied from a baby's chart. Lactation Consultation Note  Patient Name: Kari Hendricks: 01/07/2018 Reason for consult: Follow-up assessment;Mother's request;Term;Difficult latch P1, 19 hour female infant Mom w/ flat nipples, LC pre-pumped mom's breast  w/ hand pump to evert nipple out before infant latched to breast ,  LC had mom to do a nipple roll  and infant latch few minutes w/out nipple shield. LC had mom express colostrum in NS infantand  latched on left breast in cross-cradle position -15 minutes . LC flange infant top lip out and lowered lower jaw, infant then had a  rthymitic suck pattern and swallowing could be heard. Mom was pleased with latch and mom stated she could feel infant sucking stronger at breast and could tell difference w/ this feeding.  Parent have been doing a lot of STS with infant.Kari Hendricks. LC reviewed infant huger cuing, mom plans to feed baby 8-12 times within 24 hours including nights.  LC discussed a pattern of  cluster feeding is normal after 24 hours. Per mom, she has not been pumping, She plans to pump after putting infant to breast and pump w/ DEBP at least 8 times within 24 hours. Mom shown how to use DEBP & how to disassemble, clean, & reassemble parts. LC reviewed I&O with parents. Parents will call LC if have any more questions or concerns.  Maternal Data    Feeding Feeding Type: Breast Fed  LATCH Score Latch: Repeated attempts needed to sustain latch, nipple held in mouth throughout feeding, stimulation needed to elicit sucking reflex.  Audible Swallowing: Spontaneous and intermittent  Type of Nipple: Flat  Comfort (Breast/Nipple): Soft / non-tender  Hold (Positioning): Assistance needed to correctly position infant at breast and maintain latch.  LATCH Score: 7  Interventions Interventions: Assisted with latch;Skin to skin;Pre-pump if needed;Position options  Lactation Tools Discussed/Used Pump Review: Setup,  frequency, and cleaning;Milk Storage Initiated by:: Kari Hendricks , IBCLC Hendricks initiated:: 01/07/18   Consult Status Consult Status: Follow-up Hendricks: 01/08/18 Follow-up type: In-patient    Kari EarthlyRobin Pixie Hendricks 01/07/2018, 12:10 AM

## 2018-01-07 NOTE — Progress Notes (Signed)
Post Partum Day 1 Subjective: no complaints, up ad lib, voiding, tolerating PO and + flatus  Objective: Blood pressure 123/76, pulse 71, temperature 98.6 F (37 C), temperature source Oral, resp. rate 18, height 5\' 3"  (1.6 m), weight 77.8 kg (171 lb 8 oz), last menstrual period 03/26/2017, SpO2 100 %.  Physical Exam:  General: alert, cooperative and no distress Lochia: appropriate Uterine Fundus: firm Incision: healing well DVT Evaluation: No evidence of DVT seen on physical exam.  Recent Labs    01/06/18 0712 01/07/18 0552  HGB 12.8 9.9*  HCT 38.7 30.1*    Assessment/Plan: Plan for discharge tomorrow  No circumcision for newborn   LOS: 1 day   Roselle LocusJames E Drevin Ortner II 01/07/2018, 6:53 PM

## 2018-01-07 NOTE — Lactation Note (Signed)
This note was copied from a baby's chart. Lactation Consultation Note  Patient Name: Boy Samuella BruinLauren Pope Stalls Today's Date: 01/07/2018  P1, 41 hours, 3% weight loss LC entered room dad was doing STS w/ infant. Parents receptive  to following up w/LC outpatient services,  referral made by [facility] today.  Mom / baby will be scheduled for outpatient LC services when discharged.  Mom reports she is feeling more confident w/ BF and infant latching well w/ NS no pinching and he appears content after feeding. Per dad,  infant was cluster feeding and at breast more, latching w/out problems when mom is using the  nipple shield. LC unable reassess  infant's  latch,    per mom, infant  feed 20-30 minutes before LC entered room. LC examine mom nipples,  no trauma noted. Mom been hand expressing and gave infant 2ml of EBM which she gave back to infant. Mom plans to BF, then hand express and give EBM back to infant.  Doesn't like DEBP feels like not getting anything reassured mom is for breast stimulation and encourage continue to use it after feeding infant at breast.  Mom encouraged to feed baby 8-12 times/24 hours and with feeding cues.  [facility] if they have any questions or concerns. LC reinforced :O/P services, breastfeeding support groups, community resources, and our phone # for post-discharge questions.  Maternal Data    Feeding Feeding Type: Breast Fed Length of feed: 20 min  LATCH Score                   Interventions    Lactation Tools Discussed/Used     Consult Status Consult Status: Follow-up Date: 01/08/18 Follow-up type: In-patient    Danelle EarthlyRobin Lonny Eisen 01/07/2018, 10:30 PM

## 2018-01-07 NOTE — Plan of Care (Signed)
Parents and baby progressing as expected 

## 2018-01-08 LAB — RH IG WORKUP (INCLUDES ABO/RH)
ABO/RH(D): O NEG
Fetal Screen: NEGATIVE
Gestational Age(Wks): 41
UNIT DIVISION: 0

## 2018-01-08 MED ORDER — ACETAMINOPHEN 325 MG PO TABS: 650.0000 mg | ORAL_TABLET | Freq: Four times a day (QID) | ORAL | 1 refills | Status: DC | PRN

## 2018-01-08 MED ORDER — BENZOCAINE-MENTHOL 20-0.5 % EX AERO: 1.0000 "application " | INHALATION_SPRAY | Freq: Four times a day (QID) | CUTANEOUS | 2 refills | Status: DC | PRN

## 2018-01-08 MED ORDER — IBUPROFEN 600 MG PO TABS: 600.0000 mg | ORAL_TABLET | Freq: Four times a day (QID) | ORAL | 1 refills | Status: DC | PRN

## 2018-01-08 NOTE — Lactation Note (Signed)
This note was copied from a baby's chart. Lactation Consultation Note  Patient Name: Kari Hendricks TKPTW'S Date: 01/08/2018 Reason for consult: Follow-up assessment;Term;Primapara;1st time breastfeeding;Difficult latch;Infant weight loss  AS LC entered the room, baby very fussy and ready to feed,  LC assisted to latch without the NS , and latched with swallows and then latched with the NS and better depth and sustained the latch longer, milk noted in the NS , see doc flow sheets for details  For all latches and spoon feeding.  LC recommended if the baby doesn't settle down after 4 latches and spoon feeding, and post pumping to consider supplementing with some formula. Especially if the milk is slow to come in.  LC explained to mom since she has not been pumping and hasn't seen a full NS of milk after feeding, the baby is in need of calories if not settling and still showing feeding cues.  LC also recommended to watch for early feeding cues , and to get the baby at that stage and not to wait until the baby is fussy to feed.  Per mom pumped x 1 using the DEBP and didn't get anything so did not continue. Has been hand expressing, and using the hand pump with better success.  Mom expressed she is very tired, and the baby cluster fed all night. [place] suspects due to mom being so tired her milk isn't letting down has well, and the baby is getting hungry, also pumping with DEBP x 1 on the last 24 hours. Mom has been hand expressing and using the hand pump, has gotten 2-3 ml at a time.  Per mom has a prescription from the MD for the insurance company , but has not contacted them yet [employer] transport [employer]). LC recommended to mom consider renting a DEBP from the gift shop ( and reminded her she already has the DEBP  Kit to go with their DEBP ) so she can catch up with stimulation due to the use of the NS and challenging tissue for latching.  Mom denies soreness , sore nipple and engorgement prevention  and tx reviewed. LC instructed mom on the use of shells between feedings  except when sleeping, already has a hand pump and a DEBP set up .  LC discussed the importance of stimulation and supply and demand.  LC also recommended calling her insurance company in the am to check on her DEBP order.   LC also recommended since [place] has an [place] in their office to call for and LC O/P appt. In the next 2 days For a feeding assessment. If she is unable to get appt at the [facility] office to call [facility] for [facility] O/P appt.     Maternal Data Has patient been taught Hand Expression?: Yes  Feeding Feeding Type: Breast Milk Length of feed: 15 min(milk in the  NS , deeper latch with NS )  LATCH Score Latch: Grasps breast easily, tongue down, lips flanged, rhythmical sucking.  Audible Swallowing: Spontaneous and intermittent  Type of Nipple: Flat  Comfort (Breast/Nipple): Soft / non-tender  Hold (Positioning): Assistance needed to correctly position infant at breast and maintain latch.  LATCH Score: 8  Interventions Interventions: Breast feeding basics reviewed  Lactation Tools Discussed/Used Tools: Shells;Pump;Nipple Shields Nipple shield size: 24 Shell Type: Inverted Breast pump type: Double-Electric Breast Pump;Manual Pump Review: Milk Storage Initiated by:: LC / MAI reviewed  Date initiated:: 01/08/18   Consult Status Consult Status: Follow-up Date:  01/09/18(LC recommended checking with Jerelyn Scott for [facility] O/P ) Follow-up type: [place] 01/08/2018, 11:24 AM

## 2018-01-09 ENCOUNTER — Inpatient Hospital Stay (HOSPITAL_COMMUNITY): Admission: RE | Admit: 2018-01-09 | Payer: BLUE CROSS/BLUE SHIELD | Source: Ambulatory Visit

## 2018-01-09 NOTE — Discharge Summary (Signed)
Obstetric Discharge Summary Reason for Admission: onset of labor Prenatal Procedures: none Intrapartum Procedures: spontaneous vaginal delivery Postpartum Procedures: none Complications-Operative and Postpartum: none Hemoglobin  Date Value Ref Range Status  01/07/2018 9.9 (L) 12.0 - 15.0 g/dL Final    Comment:    DELTA CHECK NOTED REPEATED TO VERIFY    HCT  Date Value Ref Range Status  01/07/2018 30.1 (L) 36.0 - 46.0 % Final    Physical Exam:  General: alert, cooperative and no distress Lochia: appropriate Uterine Fundus: firm Incision: healing well DVT Evaluation: No evidence of DVT seen on physical exam.  Discharge Diagnoses: Term Pregnancy-delivered  Discharge Information: Date: 01/09/2018 Activity: pelvic rest Diet: routine Medications: PNV and Ibuprofen Condition: stable Instructions: refer to practice specific booklet Discharge to: home   Newborn Data: Live born female  Birth Weight: 7 lb 11.3 oz (3495 g) APGAR: 9, 9  Newborn Delivery   Birth date/time:  01/06/2018 04:50:00 Delivery type:  Vaginal, Spontaneous     Home with mother.  Roselle LocusJames E Vernel Langenderfer II 01/09/2018, 12:49 AM

## 2018-01-10 LAB — TYPE AND SCREEN
ABO/RH(D): O NEG
ANTIBODY SCREEN: POSITIVE
UNIT DIVISION: 0
Unit division: 0

## 2018-01-10 LAB — BPAM RBC
BLOOD PRODUCT EXPIRATION DATE: 201908212359
BLOOD PRODUCT EXPIRATION DATE: 201908222359
UNIT TYPE AND RH: 9500
Unit Type and Rh: 9500

## 2018-01-12 ENCOUNTER — Inpatient Hospital Stay (HOSPITAL_COMMUNITY): Payer: BLUE CROSS/BLUE SHIELD

## 2018-02-20 DIAGNOSIS — Z1389 Encounter for screening for other disorder: Secondary | ICD-10-CM | POA: Diagnosis not present

## 2018-03-25 ENCOUNTER — Encounter (HOSPITAL_COMMUNITY): Payer: Self-pay

## 2018-03-30 DIAGNOSIS — N6459 Other signs and symptoms in breast: Secondary | ICD-10-CM | POA: Diagnosis not present

## 2019-01-16 DIAGNOSIS — Z0000 Encounter for general adult medical examination without abnormal findings: Secondary | ICD-10-CM | POA: Diagnosis not present

## 2019-02-28 DIAGNOSIS — Z01419 Encounter for gynecological examination (general) (routine) without abnormal findings: Secondary | ICD-10-CM | POA: Diagnosis not present

## 2019-02-28 DIAGNOSIS — Z23 Encounter for immunization: Secondary | ICD-10-CM | POA: Diagnosis not present

## 2019-02-28 DIAGNOSIS — D509 Iron deficiency anemia, unspecified: Secondary | ICD-10-CM | POA: Diagnosis not present

## 2019-02-28 DIAGNOSIS — Z0000 Encounter for general adult medical examination without abnormal findings: Secondary | ICD-10-CM | POA: Diagnosis not present

## 2019-02-28 DIAGNOSIS — Z13220 Encounter for screening for lipoid disorders: Secondary | ICD-10-CM | POA: Diagnosis not present

## 2019-02-28 DIAGNOSIS — Z6820 Body mass index (BMI) 20.0-20.9, adult: Secondary | ICD-10-CM | POA: Diagnosis not present

## 2019-04-10 DIAGNOSIS — D709 Neutropenia, unspecified: Secondary | ICD-10-CM | POA: Diagnosis not present

## 2019-07-03 DIAGNOSIS — D709 Neutropenia, unspecified: Secondary | ICD-10-CM | POA: Diagnosis not present

## 2019-11-27 DIAGNOSIS — N911 Secondary amenorrhea: Secondary | ICD-10-CM | POA: Diagnosis not present

## 2019-11-27 DIAGNOSIS — O021 Missed abortion: Secondary | ICD-10-CM | POA: Diagnosis not present

## 2019-12-03 DIAGNOSIS — O360910 Maternal care for other rhesus isoimmunization, first trimester, not applicable or unspecified: Secondary | ICD-10-CM | POA: Diagnosis not present

## 2019-12-03 DIAGNOSIS — O021 Missed abortion: Secondary | ICD-10-CM | POA: Diagnosis not present

## 2019-12-04 DIAGNOSIS — O021 Missed abortion: Secondary | ICD-10-CM | POA: Diagnosis not present

## 2019-12-07 DIAGNOSIS — O021 Missed abortion: Secondary | ICD-10-CM | POA: Diagnosis not present

## 2020-01-08 DIAGNOSIS — J302 Other seasonal allergic rhinitis: Secondary | ICD-10-CM | POA: Diagnosis not present

## 2020-01-08 DIAGNOSIS — J45990 Exercise induced bronchospasm: Secondary | ICD-10-CM | POA: Diagnosis not present

## 2020-01-08 DIAGNOSIS — F419 Anxiety disorder, unspecified: Secondary | ICD-10-CM | POA: Diagnosis not present

## 2020-01-10 ENCOUNTER — Encounter: Payer: Self-pay | Admitting: Allergy

## 2020-01-10 ENCOUNTER — Other Ambulatory Visit: Payer: Self-pay

## 2020-01-10 ENCOUNTER — Ambulatory Visit (INDEPENDENT_AMBULATORY_CARE_PROVIDER_SITE_OTHER): Payer: BC Managed Care – PPO | Admitting: Allergy

## 2020-01-10 VITALS — BP 118/68 | HR 81 | Temp 98.2°F | Resp 18 | Ht 64.0 in | Wt 131.0 lb

## 2020-01-10 DIAGNOSIS — J4530 Mild persistent asthma, uncomplicated: Secondary | ICD-10-CM

## 2020-01-10 DIAGNOSIS — J3089 Other allergic rhinitis: Secondary | ICD-10-CM | POA: Diagnosis not present

## 2020-01-10 MED ORDER — ALBUTEROL SULFATE HFA 108 (90 BASE) MCG/ACT IN AERS: 2.0000 | INHALATION_SPRAY | RESPIRATORY_TRACT | 2 refills | Status: AC | PRN

## 2020-01-10 NOTE — Patient Instructions (Addendum)
-   have access to albuterol inhaler 2 puffs every 4-6 hours as needed for cough/wheeze/shortness of breath/chest tightness.  May use 15-20 minutes prior to activity.   Monitor frequency of use.    - use inhaler with spacer device   Control goals:   Full participation in all desired activities (may need albuterol before activity)  Albuterol use two time or less a week on average (not counting use with activity)  Cough interfering with sleep two time or less a month  Oral steroids no more than once a year  No hospitalizations   - environmental allergy testing is positive to grass pollens, weed pollens, tree pollens, molds, dust mite, cat  - allergen avoidance measures discussed/handouts provided.  Recommending implementing avoidance measures to help decrease your exposures to the allergens above  - for nasal congestion can use Rhinocort 2 sprays each nostril daily for 1-2 weeks at time for maximum symptom benefit  - recommend you try nasal saline rinses to help flush out your nasal passages.  This also allows for better efficacy of any medicated nasal sprays you may use  Follow-up 3-4 months or sooner if needed

## 2020-01-10 NOTE — Progress Notes (Signed)
New Patient Note  RE: Kari Hendricks MRN: [id number] DOB: 08/10/1980 Date of Office Visit: 01/10/2020  Referring provider: Aliene Beams, MD Primary care provider: Aliene Beams, MD  Chief Complaint: breathing issue  History of present illness: Kari Hendricks is a 39 y.o. female presenting today for consultation for possible allergic rhinitis.   She reports having shortness of breath sensation mostly at night.  She states she feels like she can't get a deep breath in.  She also notices this in the shower as well.  She gets anxious about it and feels like something is wrong which can worsen the breathing difficulty.  She states she can walk her dog without issue however states is not very physically active at this time.  She states this sensation comes and goes.  She states when this happens she does sometime try to "force" the breath and may have some upper chest pain related to it. No significant cough, no wheeze.  No nighttime awakenings.  She has not used any inhalers.  She was diagnosed with exercise induced asthma years ago (about 10 years ago).  However she does not recall having any significant respiratory issues with activities.  She has never had an inhaler.   She does report having seasonal allergies. She reports having nasal congestion.  Congestion is worse in the spring.  She does report having pain/pressure in the sinuses across the nasal bridge.  Occasional throat clearing.  She has not used any nose sprays.  She has used claritin the past.  She stopped using it when she got pregnant 3 years ago.  Has not done nasal rinses before.  No history of eczema or food allergy.   Review of systems: Review of Systems  Constitutional: Negative.   HENT: Positive for congestion.   Eyes: Negative.   Respiratory: Positive for shortness of breath. Negative for cough, sputum production and wheezing.   Cardiovascular: Negative.   Gastrointestinal: Negative.     Musculoskeletal: Negative.   Skin: Negative.   Neurological: Negative.     All other systems negative unless noted above in HPI  Past medical history: Past Medical History:  Diagnosis Date  . Anemia   . Appendicitis   . Asthma   . Family history of breast cancer     Past surgical history: Past Surgical History:  Procedure Laterality Date  . Eye tumor removal     left  . LAPAROSCOPIC APPENDECTOMY N/A 10/23/2013   Procedure: APPENDECTOMY LAPAROSCOPIC;  Surgeon: Liz Malady, MD;  Location: [facility] OR;  Service: General;  Laterality: N/A;  . WISDOM TOOTH EXTRACTION      Family history:  Family History  Problem Relation Age of Onset  . Hypertension Father   . Diabetes Father   . Kidney Stones Mother   . Osteoporosis Mother   . Bone cancer Maternal Uncle 17  . Breast cancer Maternal Grandmother        died in her 7s, unsure of age of dx  . Heart attack Maternal Grandfather   . Kidney cancer Paternal Grandfather     Social history: Lives in a townhome with carpeting in bedroom with heat pump and electric heating and central cooling.  Cat of 20yrs passed away 2 months ago.  No pets in home anymore.  She is a Hotel manager.  Denies smoking history.    Medication List: Current Outpatient Medications  Medication Sig Dispense Refill  . calcium carbonate (OSCAL) 1500 (600 Ca) MG TABS  tablet Take 600 mg of elemental calcium by mouth 2 (two) times daily with a meal.    . ferrous sulfate 324 MG TBEC Take 65 mg by mouth.    . Prenatal Vit-Fe Fumarate-FA (PRENATAL MULTIVITAMIN) TABS tablet Take 1 tablet by mouth daily at 12 noon.    Marland Kitchen acetaminophen (TYLENOL) 325 MG tablet Take 2 tablets (650 mg total) by mouth every 6 (six) hours as needed (for pain scale < 4). (Patient not taking: Reported on 01/10/2020) 60 tablet 1  . albuterol (VENTOLIN HFA) 108 (90 Base) MCG/ACT inhaler Inhale 2 puffs into the lungs every 4 (four) hours as needed for shortness of breath. 16 g 2  .  benzocaine-Menthol (DERMOPLAST) 20-0.5 % AERO Apply 1 application topically 4 (four) times daily as needed for irritation (perineal discomfort). (Patient not taking: Reported on 01/10/2020) 56 g 2  . ibuprofen (ADVIL,MOTRIN) 600 MG tablet Take 1 tablet (600 mg total) by mouth every 6 (six) hours as needed. (Patient not taking: Reported on 01/10/2020) 60 tablet 1   No current facility-administered medications for this visit.    Known medication allergies: No Known Allergies   Physical examination: Blood pressure 118/68, pulse 81, temperature 98.2 F (36.8 C), resp. rate 18, height 5\' 4"  (1.626 m), weight 131 lb (59.4 kg), SpO2 98 %, currently breastfeeding.  General: Alert, interactive, in no acute distress. HEENT: PERRLA,TMs pearly gray, turbinates moderately edematous without discharge, post-pharynx non erythematous. Neck: Supple without lymphadenopathy. Lungs: Clear to auscultation without wheezing, rhonchi or rales. {no increased work of breathing. CV: Normal S1, S2 without murmurs. Abdomen: Nondistended, nontender. Skin: Warm and dry, without lesions or rashes. Extremities:  No clubbing, cyanosis or edema. Neuro:   Grossly intact.  Diagnositics/Labs:  Spirometry: FEV1: 2.97 L 95%, FVC: 3.8 L 103%, ratio consistent with Nonobstructive pattern  Allergy testing: environmental allergy skin prick testing is positive to grass pollens, tree pollens, weed pollens, molds, dust mites, cat hair.  Allergy testing results were read and interpreted by provider, documented by clinical staff.   Assessment and plan: Reactive airway   - have access to albuterol inhaler 2 puffs every 4-6 hours as needed for cough/wheeze/shortness of breath/chest tightness.  May use 15-20 minutes prior to activity.   Monitor frequency of use.    - use inhaler with spacer device   Control goals:   Full participation in all desired activities (may need albuterol before activity)  Albuterol use two time or less a  week on average (not counting use with activity)  Cough interfering with sleep two time or less a month  Oral steroids no more than once a year  No hospitalizations  Allergic rhinitis  - environmental allergy testing is positive to grass pollens, weed pollens, tree pollens, molds, dust mite, cat  - allergen avoidance measures discussed/handouts provided.  Recommending implementing avoidance measures to help decrease your exposures to the allergens above  - for nasal congestion can use Rhinocort 2 sprays each nostril daily for 1-2 weeks at time for maximum symptom benefit  - recommend you try nasal saline rinses to help flush out your nasal passages.  This also allows for better efficacy of any medicated nasal sprays you may use  -May also use an antihistamine like Claritin, Zyrtec or Xyzal which would all be safe to use while breast-feeding and or during pregnancy.  Follow-up 3-4 months or sooner if needed   I appreciate the opportunity to take part in Tasheika's care. Please do not hesitate to contact me with  questions.  Sincerely,   Prudy Feeler, MD Allergy/Immunology [facility]

## 2020-02-07 ENCOUNTER — Other Ambulatory Visit: Payer: Self-pay

## 2020-02-07 ENCOUNTER — Ambulatory Visit
Admission: RE | Admit: 2020-02-07 | Discharge: 2020-02-07 | Disposition: A | Discharge status: Home / Self Care | Payer: BC Managed Care – PPO | Source: Ambulatory Visit | Attending: Family Medicine | Admitting: Family Medicine

## 2020-02-07 ENCOUNTER — Other Ambulatory Visit: Payer: Self-pay | Admitting: Family Medicine

## 2020-02-07 DIAGNOSIS — R0789 Other chest pain: Secondary | ICD-10-CM

## 2020-02-07 DIAGNOSIS — J3089 Other allergic rhinitis: Secondary | ICD-10-CM | POA: Diagnosis not present

## 2020-02-07 DIAGNOSIS — J9801 Acute bronchospasm: Secondary | ICD-10-CM | POA: Diagnosis not present

## 2020-03-11 DIAGNOSIS — D649 Anemia, unspecified: Secondary | ICD-10-CM | POA: Diagnosis not present

## 2020-03-11 DIAGNOSIS — Z23 Encounter for immunization: Secondary | ICD-10-CM | POA: Diagnosis not present

## 2020-03-11 DIAGNOSIS — Z0000 Encounter for general adult medical examination without abnormal findings: Secondary | ICD-10-CM | POA: Diagnosis not present

## 2020-03-11 DIAGNOSIS — Z13220 Encounter for screening for lipoid disorders: Secondary | ICD-10-CM | POA: Diagnosis not present

## 2020-05-07 ENCOUNTER — Ambulatory Visit: Payer: BC Managed Care – PPO | Admitting: Allergy

## 2020-06-07 NOTE — L&D Delivery Note (Signed)
Delivery Note At 2:50 PM a viable female was delivered via Vaginal, Spontaneous (Presentation: R Occiput Anterior ).  APGAR: pend , ; weight 6 lb 13.4 oz (3100 g).   Placenta status: Spontaneous, Intact.  Cord: 3 vessels with the following complications: None.   Anesthesia:   Episiotomy: None Lacerations: 2nd degree;Perineal Suture Repair: 3.0 vicryl Est. Blood Loss (mL):  150 cc  Mom to postpartum.  Baby to Couplet care / Skin to Skin.  [name] 02/02/2021, 3:26 PM

## 2020-06-24 DIAGNOSIS — N911 Secondary amenorrhea: Secondary | ICD-10-CM | POA: Diagnosis not present

## 2020-07-04 DIAGNOSIS — Z3481 Encounter for supervision of other normal pregnancy, first trimester: Secondary | ICD-10-CM | POA: Diagnosis not present

## 2020-07-04 DIAGNOSIS — Z3685 Encounter for antenatal screening for Streptococcus B: Secondary | ICD-10-CM | POA: Diagnosis not present

## 2020-07-04 LAB — OB RESULTS CONSOLE ANTIBODY SCREEN: Antibody Screen: NEGATIVE

## 2020-07-04 LAB — OB RESULTS CONSOLE ABO/RH: RH Type: NEGATIVE

## 2020-07-04 LAB — OB RESULTS CONSOLE RPR: RPR: NONREACTIVE

## 2020-07-04 LAB — OB RESULTS CONSOLE HEPATITIS B SURFACE ANTIGEN: Hepatitis B Surface Ag: NEGATIVE

## 2020-07-04 LAB — OB RESULTS CONSOLE GC/CHLAMYDIA
Chlamydia: NEGATIVE
Gonorrhea: NEGATIVE

## 2020-07-04 LAB — OB RESULTS CONSOLE HIV ANTIBODY (ROUTINE TESTING): HIV: NONREACTIVE

## 2020-07-04 LAB — OB RESULTS CONSOLE RUBELLA ANTIBODY, IGM: Rubella: IMMUNE

## 2020-07-10 DIAGNOSIS — Z3400 Encounter for supervision of normal first pregnancy, unspecified trimester: Secondary | ICD-10-CM | POA: Diagnosis not present

## 2020-07-10 DIAGNOSIS — Z113 Encounter for screening for infections with a predominantly sexual mode of transmission: Secondary | ICD-10-CM | POA: Diagnosis not present

## 2020-07-10 DIAGNOSIS — O09529 Supervision of elderly multigravida, unspecified trimester: Secondary | ICD-10-CM | POA: Diagnosis not present

## 2020-07-21 DIAGNOSIS — Z3A13 13 weeks gestation of pregnancy: Secondary | ICD-10-CM | POA: Diagnosis not present

## 2020-07-21 DIAGNOSIS — Z3682 Encounter for antenatal screening for nuchal translucency: Secondary | ICD-10-CM | POA: Diagnosis not present

## 2020-07-21 DIAGNOSIS — O09521 Supervision of elderly multigravida, first trimester: Secondary | ICD-10-CM | POA: Diagnosis not present

## 2020-08-28 DIAGNOSIS — Z3480 Encounter for supervision of other normal pregnancy, unspecified trimester: Secondary | ICD-10-CM | POA: Diagnosis not present

## 2020-08-28 DIAGNOSIS — Z363 Encounter for antenatal screening for malformations: Secondary | ICD-10-CM | POA: Diagnosis not present

## 2020-08-28 DIAGNOSIS — Z3A18 18 weeks gestation of pregnancy: Secondary | ICD-10-CM | POA: Diagnosis not present

## 2020-10-22 DIAGNOSIS — Z23 Encounter for immunization: Secondary | ICD-10-CM | POA: Diagnosis not present

## 2020-10-22 DIAGNOSIS — Z3480 Encounter for supervision of other normal pregnancy, unspecified trimester: Secondary | ICD-10-CM | POA: Diagnosis not present

## 2020-10-22 DIAGNOSIS — O360920 Maternal care for other rhesus isoimmunization, second trimester, not applicable or unspecified: Secondary | ICD-10-CM | POA: Diagnosis not present

## 2020-10-22 DIAGNOSIS — Z3A26 26 weeks gestation of pregnancy: Secondary | ICD-10-CM | POA: Diagnosis not present

## 2020-11-17 DIAGNOSIS — Z3A30 30 weeks gestation of pregnancy: Secondary | ICD-10-CM | POA: Diagnosis not present

## 2020-11-17 DIAGNOSIS — O4442 Low lying placenta NOS or without hemorrhage, second trimester: Secondary | ICD-10-CM | POA: Diagnosis not present

## 2020-11-17 DIAGNOSIS — O09522 Supervision of elderly multigravida, second trimester: Secondary | ICD-10-CM | POA: Diagnosis not present

## 2020-11-19 ENCOUNTER — Encounter: Payer: Self-pay | Admitting: *Deleted

## 2020-11-20 ENCOUNTER — Ambulatory Visit: Payer: BC Managed Care – PPO | Attending: Obstetrics and Gynecology

## 2020-11-20 ENCOUNTER — Other Ambulatory Visit: Payer: Self-pay | Admitting: *Deleted

## 2020-11-20 ENCOUNTER — Other Ambulatory Visit: Payer: Self-pay

## 2020-11-20 ENCOUNTER — Ambulatory Visit: Payer: BC Managed Care – PPO | Admitting: *Deleted

## 2020-11-20 ENCOUNTER — Other Ambulatory Visit: Payer: Self-pay | Admitting: Obstetrics & Gynecology

## 2020-11-20 VITALS — BP 115/69 | HR 77

## 2020-11-20 DIAGNOSIS — O365931 Maternal care for other known or suspected poor fetal growth, third trimester, fetus 1: Secondary | ICD-10-CM

## 2020-11-20 DIAGNOSIS — Z363 Encounter for antenatal screening for malformations: Secondary | ICD-10-CM

## 2020-11-20 DIAGNOSIS — O09523 Supervision of elderly multigravida, third trimester: Secondary | ICD-10-CM

## 2020-11-20 DIAGNOSIS — J45909 Unspecified asthma, uncomplicated: Secondary | ICD-10-CM

## 2020-11-20 DIAGNOSIS — Z3A30 30 weeks gestation of pregnancy: Secondary | ICD-10-CM | POA: Diagnosis not present

## 2020-11-20 DIAGNOSIS — O365930 Maternal care for other known or suspected poor fetal growth, third trimester, not applicable or unspecified: Secondary | ICD-10-CM

## 2020-11-20 DIAGNOSIS — O99013 Anemia complicating pregnancy, third trimester: Secondary | ICD-10-CM | POA: Diagnosis not present

## 2020-11-20 DIAGNOSIS — O99513 Diseases of the respiratory system complicating pregnancy, third trimester: Secondary | ICD-10-CM

## 2020-11-20 DIAGNOSIS — D649 Anemia, unspecified: Secondary | ICD-10-CM

## 2020-11-25 ENCOUNTER — Other Ambulatory Visit: Payer: Self-pay

## 2020-12-04 DIAGNOSIS — O26893 Other specified pregnancy related conditions, third trimester: Secondary | ICD-10-CM | POA: Diagnosis not present

## 2020-12-04 DIAGNOSIS — Z3A32 32 weeks gestation of pregnancy: Secondary | ICD-10-CM | POA: Diagnosis not present

## 2020-12-05 DIAGNOSIS — Z3A32 32 weeks gestation of pregnancy: Secondary | ICD-10-CM | POA: Diagnosis not present

## 2020-12-05 DIAGNOSIS — O365930 Maternal care for other known or suspected poor fetal growth, third trimester, not applicable or unspecified: Secondary | ICD-10-CM | POA: Diagnosis not present

## 2020-12-19 ENCOUNTER — Encounter: Payer: Self-pay | Admitting: *Deleted

## 2020-12-19 ENCOUNTER — Ambulatory Visit: Payer: BC Managed Care – PPO | Admitting: *Deleted

## 2020-12-19 ENCOUNTER — Ambulatory Visit: Payer: BC Managed Care – PPO | Attending: Obstetrics

## 2020-12-19 ENCOUNTER — Other Ambulatory Visit: Payer: Self-pay

## 2020-12-19 VITALS — BP 132/74 | HR 89

## 2020-12-19 DIAGNOSIS — Z3A34 34 weeks gestation of pregnancy: Secondary | ICD-10-CM

## 2020-12-19 DIAGNOSIS — O99013 Anemia complicating pregnancy, third trimester: Secondary | ICD-10-CM | POA: Diagnosis not present

## 2020-12-19 DIAGNOSIS — O09523 Supervision of elderly multigravida, third trimester: Secondary | ICD-10-CM | POA: Diagnosis not present

## 2020-12-19 DIAGNOSIS — D649 Anemia, unspecified: Secondary | ICD-10-CM | POA: Diagnosis not present

## 2020-12-19 DIAGNOSIS — O365930 Maternal care for other known or suspected poor fetal growth, third trimester, not applicable or unspecified: Secondary | ICD-10-CM

## 2020-12-19 DIAGNOSIS — J45909 Unspecified asthma, uncomplicated: Secondary | ICD-10-CM

## 2020-12-19 DIAGNOSIS — O99513 Diseases of the respiratory system complicating pregnancy, third trimester: Secondary | ICD-10-CM

## 2021-01-02 DIAGNOSIS — Z3685 Encounter for antenatal screening for Streptococcus B: Secondary | ICD-10-CM | POA: Diagnosis not present

## 2021-01-02 LAB — OB RESULTS CONSOLE GBS: GBS: NEGATIVE

## 2021-01-26 ENCOUNTER — Encounter (HOSPITAL_COMMUNITY): Payer: Self-pay | Admitting: *Deleted

## 2021-01-26 ENCOUNTER — Telehealth (HOSPITAL_COMMUNITY): Payer: Self-pay | Admitting: *Deleted

## 2021-01-26 NOTE — Telephone Encounter (Signed)
Preadmission screen  

## 2021-01-29 DIAGNOSIS — Z3A40 40 weeks gestation of pregnancy: Secondary | ICD-10-CM | POA: Diagnosis not present

## 2021-01-29 DIAGNOSIS — O99891 Other specified diseases and conditions complicating pregnancy: Secondary | ICD-10-CM | POA: Diagnosis not present

## 2021-02-02 ENCOUNTER — Other Ambulatory Visit: Payer: Self-pay

## 2021-02-02 ENCOUNTER — Inpatient Hospital Stay (HOSPITAL_COMMUNITY)
Admission: AD | Admit: 2021-02-02 | Discharge: 2021-02-03 | DRG: 807 | Disposition: A | Discharge status: Home / Self Care | Payer: BC Managed Care – PPO | Attending: Obstetrics and Gynecology | Admitting: Obstetrics and Gynecology

## 2021-02-02 ENCOUNTER — Encounter (HOSPITAL_COMMUNITY): Payer: Self-pay | Admitting: Obstetrics and Gynecology

## 2021-02-02 DIAGNOSIS — Z3A41 41 weeks gestation of pregnancy: Secondary | ICD-10-CM | POA: Diagnosis not present

## 2021-02-02 DIAGNOSIS — Z20822 Contact with and (suspected) exposure to covid-19: Secondary | ICD-10-CM | POA: Diagnosis not present

## 2021-02-02 DIAGNOSIS — O26893 Other specified pregnancy related conditions, third trimester: Secondary | ICD-10-CM | POA: Diagnosis present

## 2021-02-02 DIAGNOSIS — Z3490 Encounter for supervision of normal pregnancy, unspecified, unspecified trimester: Secondary | ICD-10-CM

## 2021-02-02 DIAGNOSIS — Z6791 Unspecified blood type, Rh negative: Secondary | ICD-10-CM

## 2021-02-02 DIAGNOSIS — O480 Post-term pregnancy: Secondary | ICD-10-CM | POA: Diagnosis not present

## 2021-02-02 LAB — TYPE AND SCREEN
ABO/RH(D): O NEG
Antibody Screen: NEGATIVE

## 2021-02-02 LAB — COMPREHENSIVE METABOLIC PANEL
ALT: 14 U/L (ref 0–44)
AST: 22 U/L (ref 15–41)
Albumin: 3 g/dL — ABNORMAL LOW (ref 3.5–5.0)
Alkaline Phosphatase: 104 U/L (ref 38–126)
Anion gap: 8 (ref 5–15)
BUN: 7 mg/dL (ref 6–20)
CO2: 22 mmol/L (ref 22–32)
Calcium: 9 mg/dL (ref 8.9–10.3)
Chloride: 107 mmol/L (ref 98–111)
Creatinine, Ser: 0.84 mg/dL (ref 0.44–1.00)
GFR, Estimated: >60 mL/min (ref >60)
Glucose, Bld: 90 mg/dL (ref 70–99)
Potassium: 4.2 mmol/L (ref 3.5–5.1)
Sodium: 137 mmol/L (ref 135–145)
Total Bilirubin: 0.5 mg/dL (ref 0.3–1.2)
Total Protein: 6.2 g/dL — ABNORMAL LOW (ref 6.5–8.1)

## 2021-02-02 LAB — CBC
HCT: 41.1 % (ref 36.0–46.0)
Hemoglobin: 13.4 g/dL (ref 12.0–15.0)
MCH: 25.4 pg — ABNORMAL LOW (ref 26.0–34.0)
MCHC: 32.6 g/dL (ref 30.0–36.0)
MCV: 77.8 fL — ABNORMAL LOW (ref 80.0–100.0)
Platelets: 235 10*3/uL (ref 150–400)
RBC: 5.28 MIL/uL — ABNORMAL HIGH (ref 3.87–5.11)
RDW: 14.2 % (ref 11.5–15.5)
WBC: 9.3 10*3/uL (ref 4.0–10.5)
nRBC: 0 % (ref 0.0–0.2)

## 2021-02-02 LAB — RESP PANEL BY RT-PCR (FLU A&B, COVID) ARPGX2
Influenza A by PCR: NEGATIVE
Influenza B by PCR: NEGATIVE
SARS Coronavirus 2 by RT PCR: NEGATIVE

## 2021-02-02 MED ORDER — LACTATED RINGERS IV SOLN: INTRAVENOUS | Status: DC

## 2021-02-02 MED ORDER — BENZOCAINE-MENTHOL 20-0.5 % EX AERO
1.0000 "application " | INHALATION_SPRAY | CUTANEOUS | Status: DC | PRN
  Administered 2021-02-02: 1 via TOPICAL
  Filled 2021-02-02: qty 56

## 2021-02-02 MED ORDER — ZOLPIDEM TARTRATE 5 MG PO TABS: 5.0000 mg | ORAL_TABLET | Freq: Every evening | ORAL | Status: DC | PRN

## 2021-02-02 MED ORDER — PRENATAL MULTIVITAMIN CH
1.0000 | ORAL_TABLET | Freq: Every day | ORAL | Status: DC
  Administered 2021-02-03: 1 via ORAL
  Filled 2021-02-02: qty 1

## 2021-02-02 MED ORDER — ONDANSETRON HCL 4 MG/2ML IJ SOLN: 4.0000 mg | Freq: Four times a day (QID) | INTRAMUSCULAR | Status: DC | PRN

## 2021-02-02 MED ORDER — ACETAMINOPHEN 325 MG PO TABS: 650.0000 mg | ORAL_TABLET | ORAL | Status: DC | PRN

## 2021-02-02 MED ORDER — DIBUCAINE (PERIANAL) 1 % EX OINT: 1.0000 "application " | TOPICAL_OINTMENT | CUTANEOUS | Status: DC | PRN

## 2021-02-02 MED ORDER — TETANUS-DIPHTH-ACELL PERTUSSIS 5-2.5-18.5 LF-MCG/0.5 IM SUSY: 0.5000 mL | PREFILLED_SYRINGE | Freq: Once | INTRAMUSCULAR | Status: DC

## 2021-02-02 MED ORDER — OXYCODONE-ACETAMINOPHEN 5-325 MG PO TABS: 1.0000 | ORAL_TABLET | ORAL | Status: DC | PRN

## 2021-02-02 MED ORDER — OXYCODONE-ACETAMINOPHEN 5-325 MG PO TABS: 2.0000 | ORAL_TABLET | ORAL | Status: DC | PRN

## 2021-02-02 MED ORDER — OXYTOCIN 10 UNIT/ML IJ SOLN
INTRAMUSCULAR | Status: AC
  Filled 2021-02-02: qty 1

## 2021-02-02 MED ORDER — IBUPROFEN 600 MG PO TABS
600.0000 mg | ORAL_TABLET | Freq: Four times a day (QID) | ORAL | Status: DC
  Administered 2021-02-02 – 2021-02-03 (×5): 600 mg via ORAL
  Filled 2021-02-02 (×6): qty 1

## 2021-02-02 MED ORDER — LIDOCAINE HCL (PF) 1 % IJ SOLN
30.0000 mL | INTRAMUSCULAR | Status: DC | PRN
Start: 2021-02-02 — End: 2021-02-02
  Filled 2021-02-02: qty 30

## 2021-02-02 MED ORDER — DIPHENHYDRAMINE HCL 25 MG PO CAPS: 25.0000 mg | ORAL_CAPSULE | Freq: Four times a day (QID) | ORAL | Status: DC | PRN

## 2021-02-02 MED ORDER — OXYTOCIN-SODIUM CHLORIDE 30-0.9 UT/500ML-% IV SOLN: 2.5000 [IU]/h | INTRAVENOUS | Status: DC

## 2021-02-02 MED ORDER — BUTORPHANOL TARTRATE 1 MG/ML IJ SOLN: 1.0000 mg | INTRAMUSCULAR | Status: DC | PRN

## 2021-02-02 MED ORDER — HYDROXYZINE HCL 50 MG PO TABS: 50.0000 mg | ORAL_TABLET | Freq: Four times a day (QID) | ORAL | Status: DC | PRN

## 2021-02-02 MED ORDER — OXYTOCIN 10 UNIT/ML IJ SOLN
10.0000 [IU] | Freq: Once | INTRAMUSCULAR | Status: AC
  Administered 2021-02-02: 10 [IU] via INTRAMUSCULAR

## 2021-02-02 MED ORDER — ONDANSETRON HCL 4 MG PO TABS: 4.0000 mg | ORAL_TABLET | ORAL | Status: DC | PRN

## 2021-02-02 MED ORDER — LACTATED RINGERS IV SOLN: 500.0000 mL | INTRAVENOUS | Status: DC | PRN

## 2021-02-02 MED ORDER — SOD CITRATE-CITRIC ACID 500-334 MG/5ML PO SOLN: 30.0000 mL | ORAL | Status: DC | PRN

## 2021-02-02 MED ORDER — WITCH HAZEL-GLYCERIN EX PADS: 1.0000 "application " | MEDICATED_PAD | CUTANEOUS | Status: DC | PRN

## 2021-02-02 MED ORDER — ONDANSETRON HCL 4 MG/2ML IJ SOLN: 4.0000 mg | INTRAMUSCULAR | Status: DC | PRN

## 2021-02-02 MED ORDER — SENNOSIDES-DOCUSATE SODIUM 8.6-50 MG PO TABS
2.0000 | ORAL_TABLET | ORAL | Status: DC
  Administered 2021-02-02: 2 via ORAL
  Filled 2021-02-02: qty 2

## 2021-02-02 MED ORDER — SIMETHICONE 80 MG PO CHEW: 80.0000 mg | CHEWABLE_TABLET | ORAL | Status: DC | PRN

## 2021-02-02 MED ORDER — OXYTOCIN BOLUS FROM INFUSION: 333.0000 mL | Freq: Once | INTRAVENOUS | Status: DC

## 2021-02-02 MED ORDER — COCONUT OIL OIL: 1.0000 "application " | TOPICAL_OIL | Status: DC | PRN

## 2021-02-02 NOTE — Plan of Care (Signed)
Pt demonstrated understanding 

## 2021-02-02 NOTE — MAU Note (Signed)
Pt arrived to MAU in active labor 7/90/-2 FHR  120. Expedited patient transfer to l&d. Luna Kitchens, CNM at bedside for transport.

## 2021-02-02 NOTE — H&P (Signed)
OB History and Physical   Kari Hendricks is a 40 y.o. female [phone] presenting for contractions at [redacted]w[redacted]d.She was seen in the office and cervix was 3-4/60/-2.  Mild range BP was also noted in the office, patient denies HA, vision changes, RUQ pain. Pregnancy course notable for AMA, previously noted concern for IUGR with resolution to normal EFW at MFM.   Upon presentation to MAU, cervix noted to be 7/90/-1. She was admitted for labor.  GBS negative, Rh negative s/p Rhogam. Low risk Female on 15. She delivered her first without epidural and she plans to do the same.    OB History     Gravida  4   Para  1   Term  1   Preterm      AB  2   Living  1      SAB  2   IAB      Ectopic      Multiple      Live Births  1          Past Medical History:  Diagnosis Date   Anemia    Appendicitis    Asthma    Family history of breast cancer    Past Surgical History:  Procedure Laterality Date   Eye tumor removal     left   LAPAROSCOPIC APPENDECTOMY N/A 10/23/2013   Procedure: APPENDECTOMY LAPAROSCOPIC;  Surgeon: Liz Malady, MD;  Location: [facility] OR;  Service: General;  Laterality: N/A;   WISDOM TOOTH EXTRACTION     Family History: family history includes Bone cancer (age of onset: 41) in her maternal uncle; Breast cancer in her maternal grandmother; Diabetes in her father; Heart attack in her maternal grandfather; Hypertension in her father; Kidney Stones in her mother; Kidney cancer in her paternal grandfather; Osteoporosis in her mother. Social History:  reports that she has never smoked. She has never used smokeless tobacco. She reports current alcohol use. She reports that she does not use drugs.     Maternal Diabetes: No Genetic Screening: Normal Maternal Ultrasounds/Referrals: Normal Fetal Ultrasounds or other Referrals:  None Maternal Substance Abuse:  No Significant Maternal Medications:  None Significant Maternal Lab Results:  Group B Strep  negative Other Comments:  None  Review of Systems History Dilation: 7 Effacement (%): 90 Station: -2 Exam by:: [employer], RN Blood pressure (!) 149/86, pulse 77, temperature 98.5 F (36.9 C), temperature source Oral, resp. rate 16, last menstrual period 04/18/2020, currently breastfeeding. Exam Physical Exam  Gen: alert, well appearing, moderate distress with contractions Chest: nonlabored breathing CV: no peripheral edema Abdomen: gravid, ctx present Ext: no evidence of DVT  Prenatal labs: ABO, Rh: O/Negative/-- (01/28 0000) Antibody: Negative (01/28 0000) Rubella: Immune (01/28 0000) RPR: Nonreactive (01/28 0000)  HBsAg: Negative (01/28 0000)  HIV: Non-reactive (01/28 0000)  GBS:     Assessment/Plan: Admit to Labor and Delivery Does not desire epidural GBS negative Mild range BP noted at office and upon presentation. Will add PIH labs to admission labs.  Anticipate vaginal delivery  Kari Hendricks 02/02/2021, 1:37 PM

## 2021-02-02 NOTE — MAU Note (Signed)
Pt reports ctx's that 3-4 minutes apart that started 0945.   Denies LOF   Denies vaginal bleeding.   Reports +FM

## 2021-02-02 NOTE — Progress Notes (Signed)
Times that fhr moniter appears to have decel pt is either in hand and knees or is leaning at 90 degrees to the floor

## 2021-02-02 NOTE — Progress Notes (Signed)
Pt constantly changing position.  Very dificult to keep baby  on monitor

## 2021-02-03 ENCOUNTER — Inpatient Hospital Stay (HOSPITAL_COMMUNITY)
Admission: AD | Admit: 2021-02-03 | Payer: BC Managed Care – PPO | Source: Home / Self Care | Admitting: Obstetrics and Gynecology

## 2021-02-03 ENCOUNTER — Inpatient Hospital Stay (HOSPITAL_COMMUNITY): Payer: BC Managed Care – PPO

## 2021-02-03 LAB — CBC
HCT: 33.9 % — ABNORMAL LOW (ref 36.0–46.0)
Hemoglobin: 10.8 g/dL — ABNORMAL LOW (ref 12.0–15.0)
MCH: 25.2 pg — ABNORMAL LOW (ref 26.0–34.0)
MCHC: 31.9 g/dL (ref 30.0–36.0)
MCV: 79.2 fL — ABNORMAL LOW (ref 80.0–100.0)
Platelets: 201 10*3/uL (ref 150–400)
RBC: 4.28 MIL/uL (ref 3.87–5.11)
RDW: 14.4 % (ref 11.5–15.5)
WBC: 10.7 10*3/uL — ABNORMAL HIGH (ref 4.0–10.5)
nRBC: 0 % (ref 0.0–0.2)

## 2021-02-03 LAB — RPR: RPR Ser Ql: NONREACTIVE

## 2021-02-03 MED ORDER — RHO D IMMUNE GLOBULIN 1500 UNIT/2ML IJ SOSY
300.0000 ug | PREFILLED_SYRINGE | Freq: Once | INTRAMUSCULAR | Status: AC
  Administered 2021-02-03: 300 ug via INTRAMUSCULAR
  Filled 2021-02-03: qty 2

## 2021-02-03 MED ORDER — IBUPROFEN 600 MG PO TABS: 600.0000 mg | ORAL_TABLET | Freq: Four times a day (QID) | ORAL | 0 refills | Status: AC

## 2021-02-03 MED ORDER — ACETAMINOPHEN 325 MG PO TABS: 650.0000 mg | ORAL_TABLET | ORAL | 0 refills | Status: AC | PRN

## 2021-02-03 NOTE — Lactation Note (Signed)
This note was copied from a baby's chart. Lactation Consultation Note  Patient Name: Kari Hendricks [id number] Date: 02/03/2021 Reason for consult: Initial assessment;Term Age:38 hours   P2 mother whose infant is now 94 hours old.  This is a term baby at 41+1 weeks.  Mother breast fed her first child (now 35 years old) for 2 years, 3 months.  Mother had baby latched when I arrived.  Offered a couple of suggestions for better positioning and latching.  Mother receptive.  Suggested she get herself comfortable prior to latching.  Mother was sitting up in bed leaning forward and using her leg as a support.  Suggested using a pillow on her "boppy" so she could sit more upright with better support.  Mother may try this next time.  Mother's right nipple is everted, however, the left nipple is flat.  Mother reported having a difficult time latching to this side.  She is not interested in using a NS unless absolutely necessary.  Suggested mother allow herself and baby more time to practice latching prior to using a NS.  Her breasts are compressible; discussed ways to obtain a deep latch.  Mother in agreement.  Reviewed breast feeding basics and encouraged feeding 8-12 times/24 hours or sooner if baby shows feeding cues.  Allowed time for questions and verbalization during my visit.  Parents very receptive to teaching.    Mother has a DEBP for home use and will call her insurance company to obtain a new pump.  Mom made aware of O/P services, breastfeeding support groups, community resources, and our phone # for post-discharge questions.     Maternal Data Has patient been taught Hand Expression?: Yes Does the patient have breastfeeding experience prior to this delivery?: Yes How long did the patient breastfeed?: 2 years, 3 months  Feeding Mother's Current Feeding Choice: Breast Milk  LATCH Score Latch: Grasps breast easily, tongue down, lips flanged, rhythmical sucking.  Audible Swallowing: A  few with stimulation  Type of Nipple: Everted at rest and after stimulation  Comfort (Breast/Nipple): Soft / non-tender  Hold (Positioning): No assistance needed to correctly position infant at breast.  LATCH Score: 9   Lactation Tools Discussed/Used Tools: Coconut oil (Mother has her own coconut oil)  Interventions Interventions: Breast feeding basics reviewed;Skin to skin;Support pillows;Coconut oil;Position options;Expressed milk  Discharge Pump: Personal WIC Program: No  Consult Status Consult Status: Follow-up Date: 02/04/21 Follow-up type: In-patient    Dora Sims 02/03/2021, 8:57 AM

## 2021-02-03 NOTE — Progress Notes (Signed)
Postpartum Progress Note  Post Partum Day 1 s/p spontaneous vaginal delivery.  Patient reports well-controlled pain, ambulating without difficulty, voiding spontaneously, tolerating PO.  Vaginal bleeding is appropriate.   Objective: Blood pressure 131/86, pulse 67, temperature 98.2 F (36.8 C), temperature source Oral, resp. rate 18, height 5\' 3"  (1.6 m), weight 81.7 kg, last menstrual period 04/18/2020, SpO2 98 %, unknown if currently breastfeeding.  Physical Exam:  General: alert and no distress Lochia: appropriate Uterine Fundus: firm DVT Evaluation: No evidence of DVT seen on physical exam.  Recent Labs    02/02/21 1351 02/03/21 0517  HGB 13.4 10.8*  HCT 41.1 33.9*    Assessment/Plan: Postpartum Day 1, s/p vaginal delivery. Continue routine postpartum care BP normotensive since delivery Lactation following Anticipate discharge home today   LOS: 1 day   02/05/21 02/03/2021, 6:38 AM

## 2021-02-04 LAB — RH IG WORKUP (INCLUDES ABO/RH)
Fetal Screen: NEGATIVE
Gestational Age(Wks): 41
Unit division: 0

## 2021-02-04 NOTE — Discharge Summary (Addendum)
Obstetric Discharge Summary  Kari Hendricks is a 40 y.o. female that presented on 02/02/2021 for contractions.  She was admitted to labor and delivery for her delivery.  Her labor course was uncomplicated and she delivered a viable female infant on 02/02/21.  Her postpartum course was uncomplicated and on PPD#1, she reported well controlled pain, spontaneous voiding, ambulating without difficulty, and tolerating PO.  She was stable for discharge home on 02/03/21 with plans for in-office follow up.  Hemoglobin  Date Value Ref Range Status  02/03/2021 10.8 (L) 12.0 - 15.0 g/dL Final   HCT  Date Value Ref Range Status  02/03/2021 33.9 (L) 36.0 - 46.0 % Final    Physical Exam:  General: alert and no distress Lochia: appropriate Uterine Fundus: firm DVT Evaluation: No evidence of DVT seen on physical exam.  Discharge Diagnoses: Term Pregnancy-delivered  Discharge Information: Date: 02/03/2021 Activity: Pelvic rest, as tolerated Diet: routine Medications: Tylenol, motrin Condition: stable Instructions: Refer to practice specific booklet.  Discussed prior to discharge.  Discharge to: Home  Follow-up Information     [place] Follow up.   Why: Please follow up for 6 week postpartum visit. Contact information: [address] [place] [address] [phone]                 Newborn Data: Live born female  Birth Weight: 8 lb 2.5 oz (3700 g) APGAR: 8, 8  Newborn Delivery   Birth date/time: 02/02/2021 14:50:00 Delivery type: Vaginal, Spontaneous      Home with mother.  Kari Hendricks 02/04/2021, 7:25 AM

## 2021-02-14 ENCOUNTER — Telehealth (HOSPITAL_COMMUNITY): Payer: Self-pay

## 2021-02-14 NOTE — Telephone Encounter (Signed)
"  I'm doing good. Everything is going good." Patient declines questions or concerns about her healing.  "She's doing good. She sleeps in a bassinet." RN reviewed ABC's of safe sleep with patient. Patient declines any questions or concerns about baby.  EPDS score is 3.  Marcelino Duster [facility] 02/14/2021,1413

## 2021-03-16 DIAGNOSIS — Z1389 Encounter for screening for other disorder: Secondary | ICD-10-CM | POA: Diagnosis not present

## 2021-03-17 DIAGNOSIS — D485 Neoplasm of uncertain behavior of skin: Secondary | ICD-10-CM | POA: Diagnosis not present

## 2021-03-17 DIAGNOSIS — L821 Other seborrheic keratosis: Secondary | ICD-10-CM | POA: Diagnosis not present

## 2021-03-24 DIAGNOSIS — Z23 Encounter for immunization: Secondary | ICD-10-CM | POA: Diagnosis not present

## 2021-03-24 DIAGNOSIS — Z0000 Encounter for general adult medical examination without abnormal findings: Secondary | ICD-10-CM | POA: Diagnosis not present

## 2021-03-24 DIAGNOSIS — Z13220 Encounter for screening for lipoid disorders: Secondary | ICD-10-CM | POA: Diagnosis not present

## 2021-06-17 DIAGNOSIS — U071 COVID-19: Secondary | ICD-10-CM | POA: Diagnosis not present

## 2022-04-01 DIAGNOSIS — Z13220 Encounter for screening for lipoid disorders: Secondary | ICD-10-CM | POA: Diagnosis not present

## 2022-04-01 DIAGNOSIS — Z23 Encounter for immunization: Secondary | ICD-10-CM | POA: Diagnosis not present

## 2022-04-01 DIAGNOSIS — Z0000 Encounter for general adult medical examination without abnormal findings: Secondary | ICD-10-CM | POA: Diagnosis not present

## 2022-04-09 DIAGNOSIS — R7401 Elevation of levels of liver transaminase levels: Secondary | ICD-10-CM | POA: Diagnosis not present

## 2022-04-27 DIAGNOSIS — M545 Low back pain, unspecified: Secondary | ICD-10-CM | POA: Diagnosis not present

## 2022-06-21 IMAGING — CR DG CHEST 2V
2 series · 2 of 2 positions shown · non-contrast
Comparison: None.

CLINICAL DATA: Chest tightness.

EXAM:
CHEST - 2 VIEW

[w chest pa]
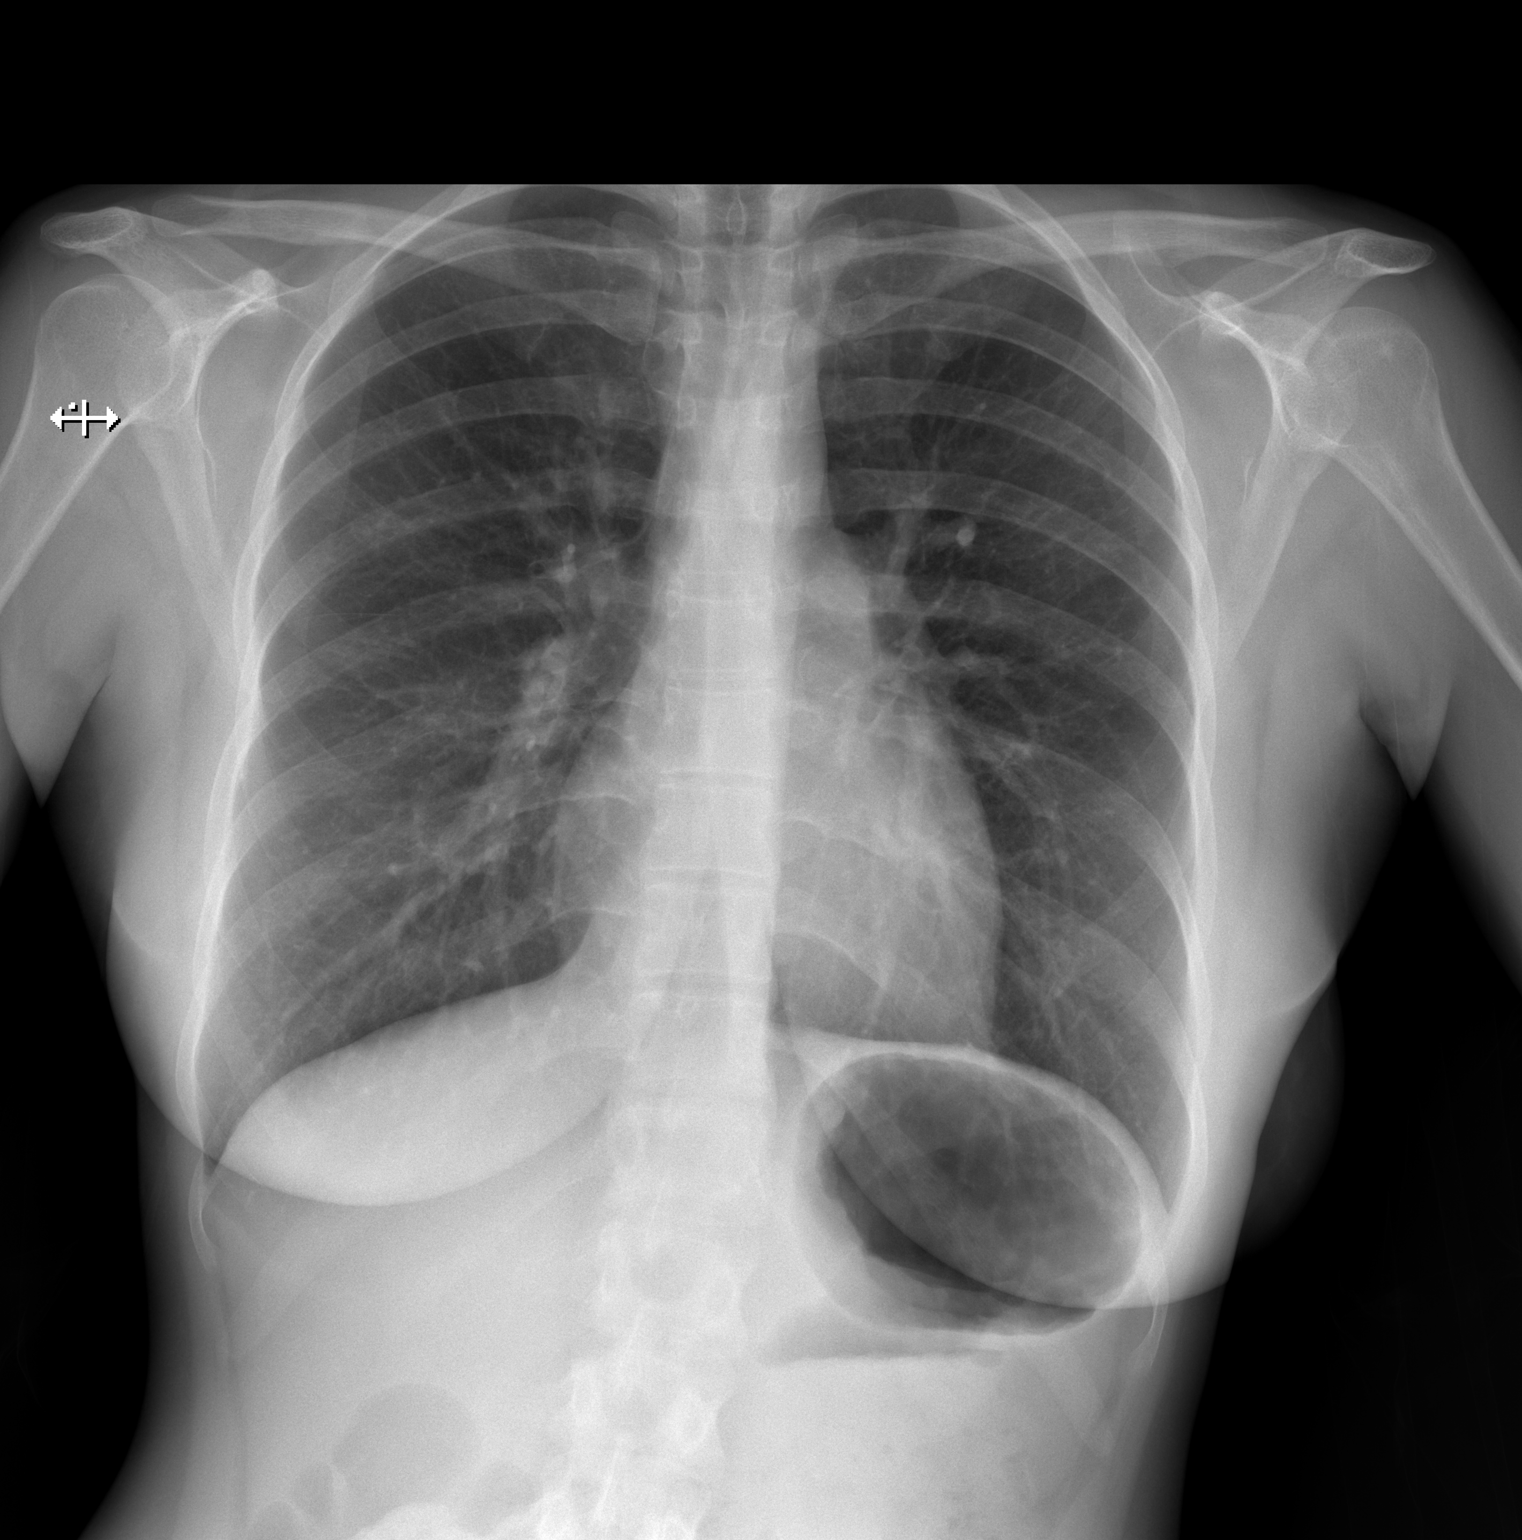

[w chest lat]
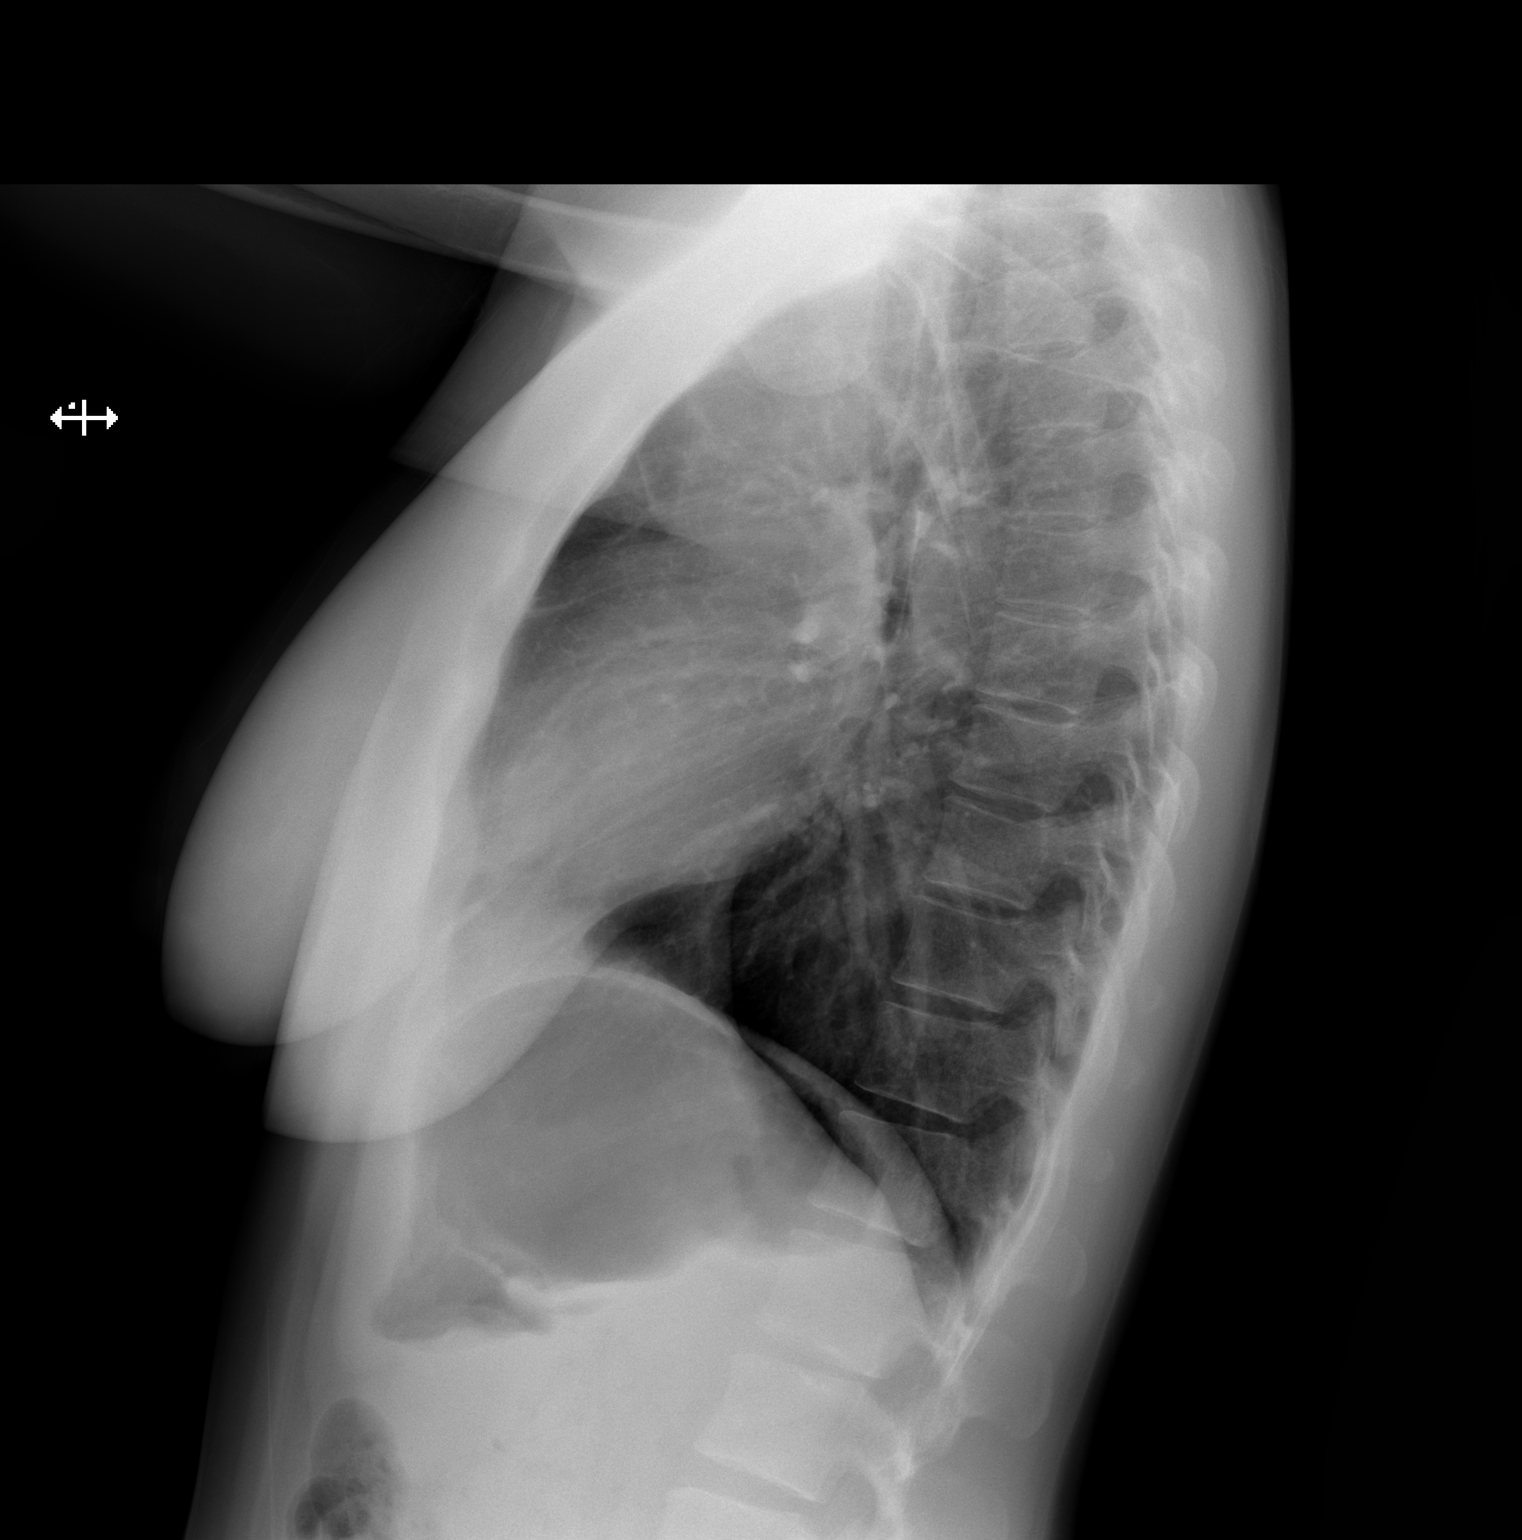

[2 of 2 positions shown; findings below may reference images not displayed]

FINDINGS: There is no evidence of acute infiltrate, pleural effusion or
pneumothorax. The heart size and mediastinal contours are within
normal limits. The visualized skeletal structures are unremarkable.
IMPRESSION: No active cardiopulmonary disease.

## 2022-07-09 DIAGNOSIS — R7401 Elevation of levels of liver transaminase levels: Secondary | ICD-10-CM | POA: Diagnosis not present

## 2022-08-17 DIAGNOSIS — Z1151 Encounter for screening for human papillomavirus (HPV): Secondary | ICD-10-CM | POA: Diagnosis not present

## 2022-08-17 DIAGNOSIS — Z6822 Body mass index (BMI) 22.0-22.9, adult: Secondary | ICD-10-CM | POA: Diagnosis not present

## 2022-08-17 DIAGNOSIS — Z124 Encounter for screening for malignant neoplasm of cervix: Secondary | ICD-10-CM | POA: Diagnosis not present

## 2022-08-17 DIAGNOSIS — Z01419 Encounter for gynecological examination (general) (routine) without abnormal findings: Secondary | ICD-10-CM | POA: Diagnosis not present

## 2023-04-28 DIAGNOSIS — Z13220 Encounter for screening for lipoid disorders: Secondary | ICD-10-CM | POA: Diagnosis not present

## 2023-04-28 DIAGNOSIS — Z0000 Encounter for general adult medical examination without abnormal findings: Secondary | ICD-10-CM | POA: Diagnosis not present

## 2023-04-28 DIAGNOSIS — D649 Anemia, unspecified: Secondary | ICD-10-CM | POA: Diagnosis not present

## 2023-08-26 DIAGNOSIS — Z1151 Encounter for screening for human papillomavirus (HPV): Secondary | ICD-10-CM | POA: Diagnosis not present

## 2023-08-26 DIAGNOSIS — Z6824 Body mass index (BMI) 24.0-24.9, adult: Secondary | ICD-10-CM | POA: Diagnosis not present

## 2023-08-26 DIAGNOSIS — Z01419 Encounter for gynecological examination (general) (routine) without abnormal findings: Secondary | ICD-10-CM | POA: Diagnosis not present

## 2023-08-26 DIAGNOSIS — Z124 Encounter for screening for malignant neoplasm of cervix: Secondary | ICD-10-CM | POA: Diagnosis not present

## 2023-09-29 DIAGNOSIS — Z1231 Encounter for screening mammogram for malignant neoplasm of breast: Secondary | ICD-10-CM | POA: Diagnosis not present

## 2023-10-14 DIAGNOSIS — R140 Abdominal distension (gaseous): Secondary | ICD-10-CM | POA: Diagnosis not present

## 2023-10-14 DIAGNOSIS — R109 Unspecified abdominal pain: Secondary | ICD-10-CM | POA: Diagnosis not present

## 2024-04-06 DIAGNOSIS — Z23 Encounter for immunization: Secondary | ICD-10-CM | POA: Diagnosis not present
